# Patient Record
Sex: Male | Born: 1945 | Race: White | Hispanic: No | Smoking: Former smoker
Health system: Southern US, Community
[De-identification: ages and names within clinical notes are randomized; demographics above are authoritative.]

## PROBLEM LIST (undated history)

## (undated) DIAGNOSIS — Z87442 Personal history of urinary calculi: Secondary | ICD-10-CM

## (undated) DIAGNOSIS — E119 Type 2 diabetes mellitus without complications: Secondary | ICD-10-CM

## (undated) DIAGNOSIS — I10 Essential (primary) hypertension: Secondary | ICD-10-CM

## (undated) DIAGNOSIS — K219 Gastro-esophageal reflux disease without esophagitis: Secondary | ICD-10-CM

## (undated) DIAGNOSIS — M1990 Unspecified osteoarthritis, unspecified site: Secondary | ICD-10-CM

## (undated) DIAGNOSIS — I714 Abdominal aortic aneurysm, without rupture, unspecified: Secondary | ICD-10-CM

## (undated) DIAGNOSIS — I739 Peripheral vascular disease, unspecified: Secondary | ICD-10-CM

## (undated) DIAGNOSIS — N400 Enlarged prostate without lower urinary tract symptoms: Secondary | ICD-10-CM

## (undated) DIAGNOSIS — J189 Pneumonia, unspecified organism: Secondary | ICD-10-CM

## (undated) HISTORY — DX: Abdominal aortic aneurysm, without rupture, unspecified: I71.40

## (undated) HISTORY — DX: Abdominal aortic aneurysm, without rupture: I71.4

## (undated) HISTORY — PX: HERNIA REPAIR: SHX51

---

## 2006-12-27 HISTORY — PX: TRANSURETHRAL RESECTION OF PROSTATE: SHX73

## 2007-01-24 ENCOUNTER — Encounter: Admission: RE | Admit: 2007-01-24 | Discharge: 2007-01-24 | Payer: Self-pay | Admitting: Family Medicine

## 2007-08-09 ENCOUNTER — Encounter: Admission: RE | Admit: 2007-08-09 | Discharge: 2007-08-09 | Payer: Self-pay | Admitting: Orthopedic Surgery

## 2007-12-31 IMAGING — CR DG SHOULDER 2+V*L*
3 series · 3 of 3 positions shown · non-contrast
Comparison: none

CLINICAL DATA: Left shoulder pain, no injury

LEFT SHOULDER - 3 VIEW

[w shoulder ap internal left]
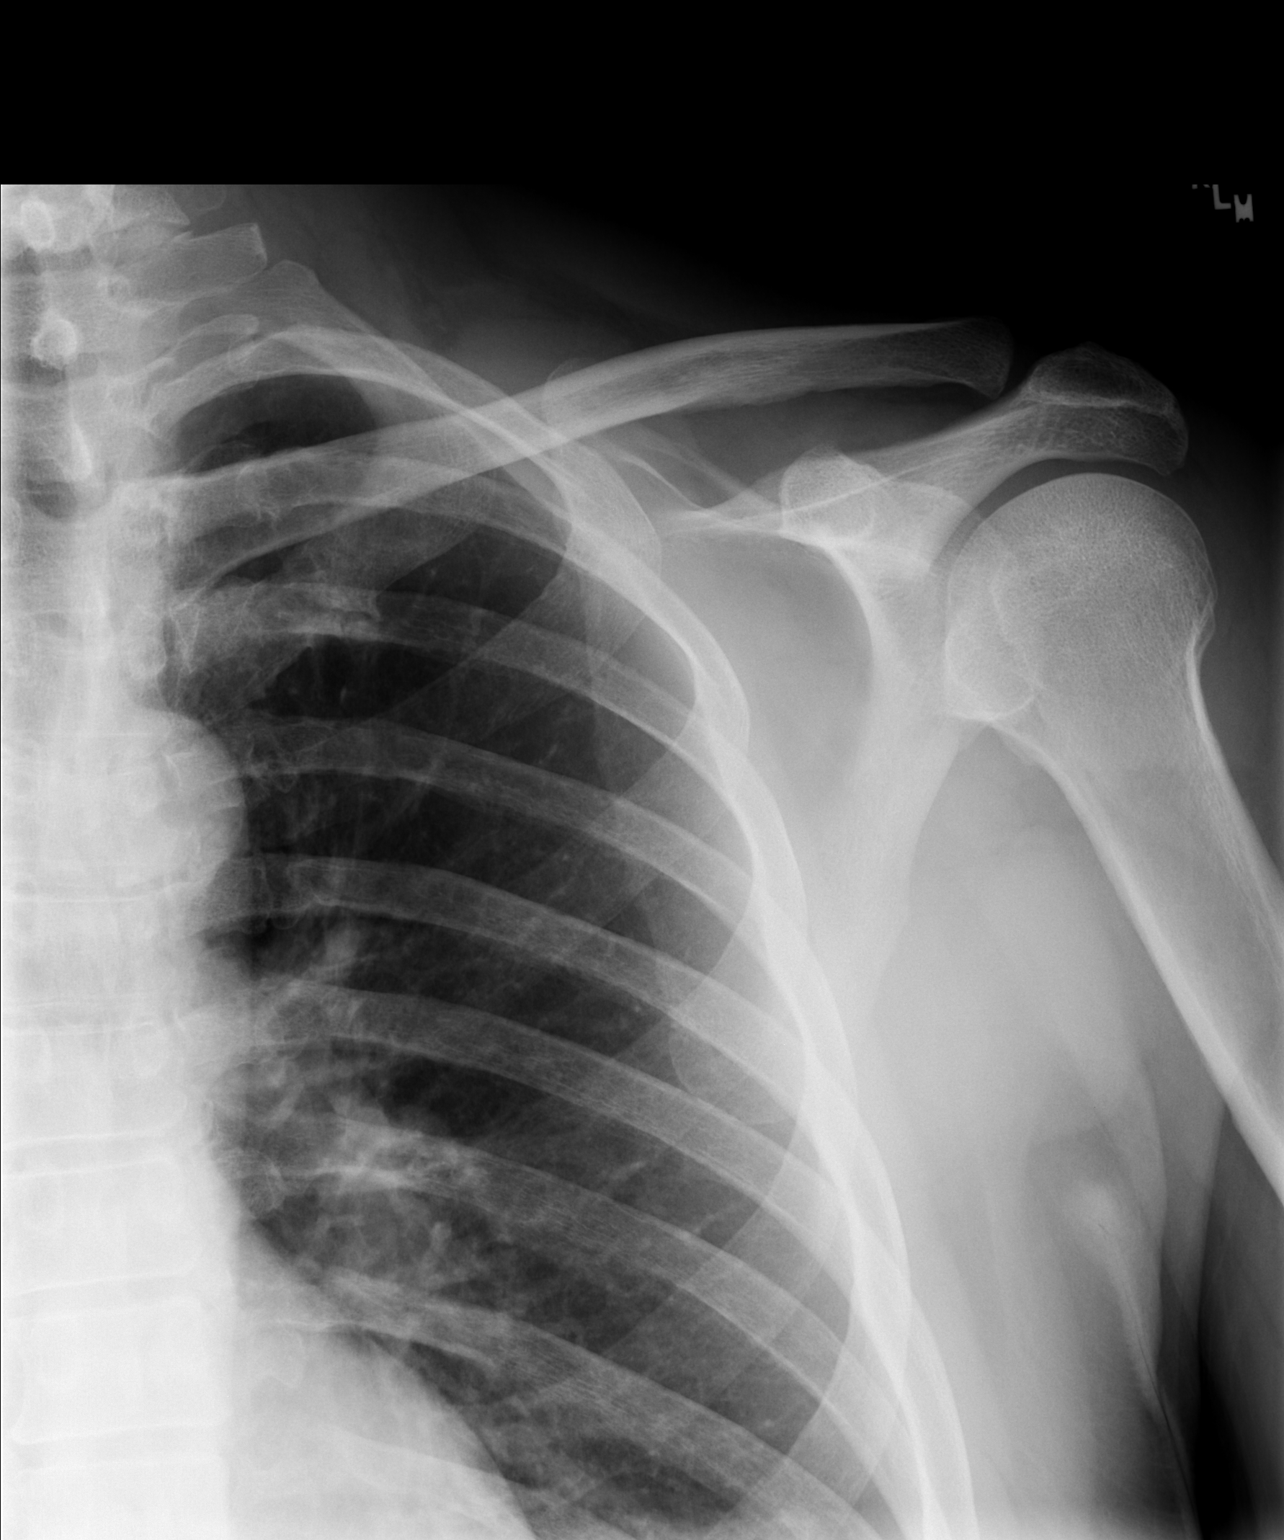

[w shoulder ap external left]
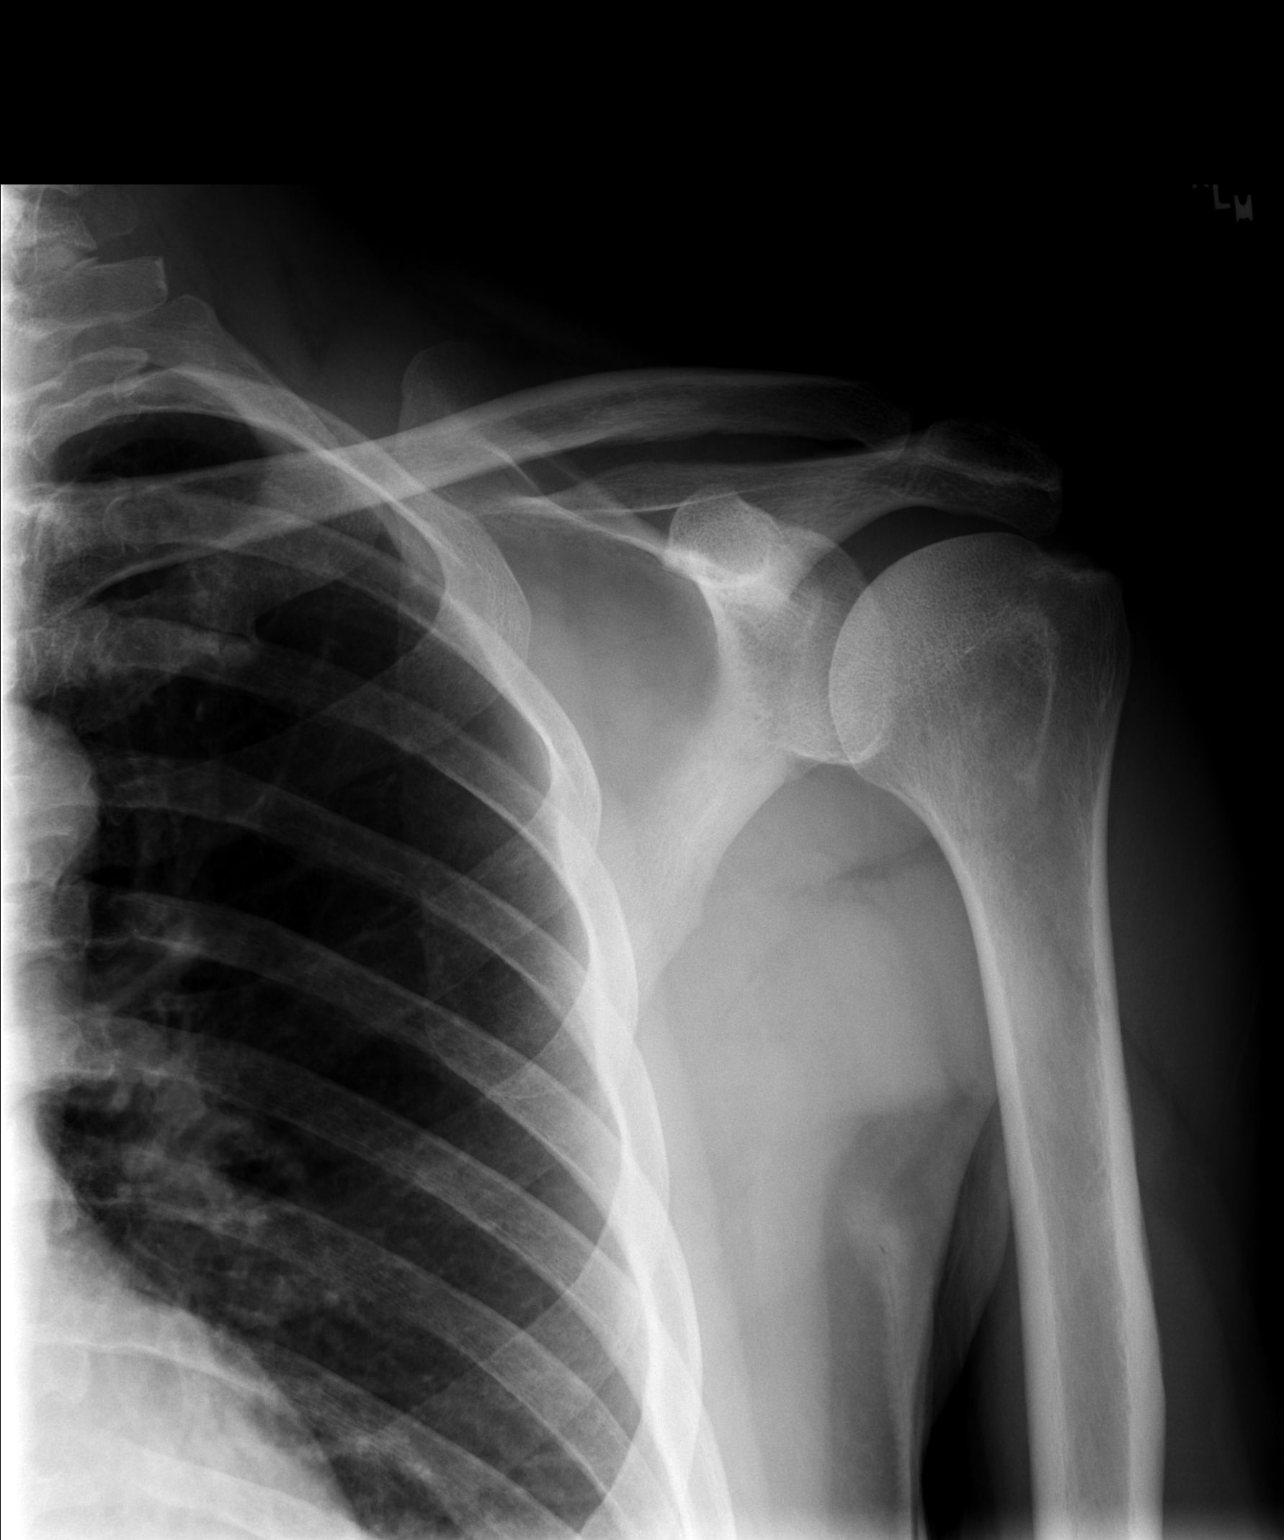

[w shoulder y view left]
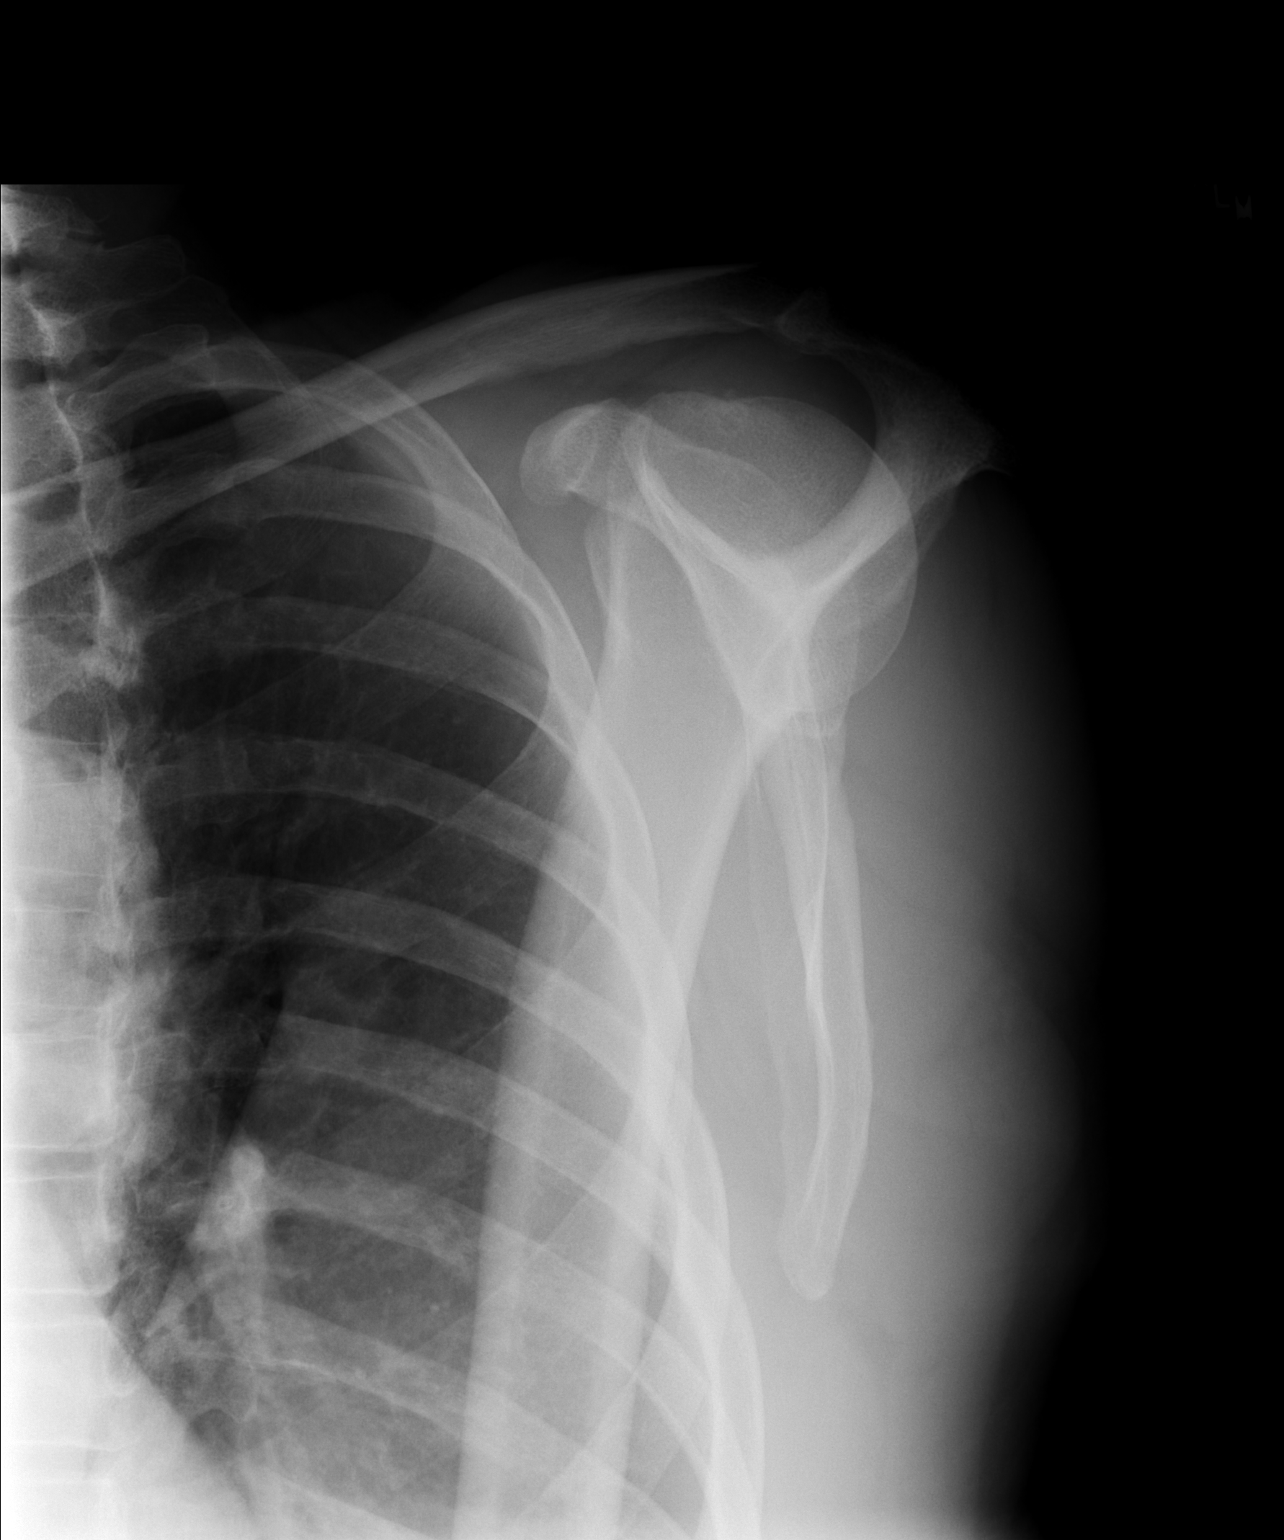

[3 of 3 positions shown; findings below may reference images not displayed]

FINDINGS: There is cortical irregularity at the insertion of the rotator cuff
compatible with chronic rotator cuff degenerative change. Early degenerative
disease noted in the left AC joint. No fracture, subluxation, or dislocation.

IMPRESSION

Findings compatible with chronic rotator cuff disease with irregularity at the
insertion site of the rotator cuff.

No acute findings.

## 2015-01-17 ENCOUNTER — Encounter (HOSPITAL_COMMUNITY): Payer: Self-pay | Admitting: *Deleted

## 2015-01-21 ENCOUNTER — Other Ambulatory Visit: Payer: Self-pay | Admitting: Gastroenterology

## 2015-01-22 ENCOUNTER — Encounter (HOSPITAL_COMMUNITY)
Admission: RE | Disposition: A | Discharge status: Home / Self Care | Payer: Self-pay | Source: Ambulatory Visit | Attending: Gastroenterology

## 2015-01-22 ENCOUNTER — Ambulatory Visit (HOSPITAL_COMMUNITY)
Admission: RE | Admit: 2015-01-22 | Discharge: 2015-01-22 | Disposition: A | Discharge status: Home / Self Care | Payer: Medicare Other | Source: Ambulatory Visit | Attending: Gastroenterology | Admitting: Gastroenterology

## 2015-01-22 ENCOUNTER — Ambulatory Visit (HOSPITAL_COMMUNITY): Payer: Medicare Other | Admitting: Anesthesiology

## 2015-01-22 ENCOUNTER — Encounter (HOSPITAL_COMMUNITY): Payer: Self-pay | Admitting: *Deleted

## 2015-01-22 DIAGNOSIS — Z7982 Long term (current) use of aspirin: Secondary | ICD-10-CM | POA: Insufficient documentation

## 2015-01-22 DIAGNOSIS — Z87891 Personal history of nicotine dependence: Secondary | ICD-10-CM | POA: Insufficient documentation

## 2015-01-22 DIAGNOSIS — K219 Gastro-esophageal reflux disease without esophagitis: Secondary | ICD-10-CM | POA: Insufficient documentation

## 2015-01-22 DIAGNOSIS — K319 Disease of stomach and duodenum, unspecified: Secondary | ICD-10-CM | POA: Diagnosis not present

## 2015-01-22 HISTORY — PX: FINE NEEDLE ASPIRATION: SHX5430

## 2015-01-22 HISTORY — DX: Unspecified osteoarthritis, unspecified site: M19.90

## 2015-01-22 HISTORY — PX: EUS: SHX5427

## 2015-01-22 HISTORY — DX: Gastro-esophageal reflux disease without esophagitis: K21.9

## 2015-01-22 SURGERY — ESOPHAGEAL ENDOSCOPIC ULTRASOUND (EUS) RADIAL
Anesthesia: Monitor Anesthesia Care

## 2015-01-22 MED ORDER — PROPOFOL 10 MG/ML IV BOLUS
INTRAVENOUS | Status: AC
  Filled 2015-01-22: qty 20

## 2015-01-22 MED ORDER — PROPOFOL INFUSION 10 MG/ML OPTIME
INTRAVENOUS | Status: DC | PRN
  Administered 2015-01-22: 100 ug/kg/min via INTRAVENOUS

## 2015-01-22 MED ORDER — FENTANYL CITRATE 0.05 MG/ML IJ SOLN: 25.0000 ug | INTRAMUSCULAR | Status: DC | PRN

## 2015-01-22 MED ORDER — LACTATED RINGERS IV SOLN: INTRAVENOUS | Status: DC

## 2015-01-22 MED ORDER — SODIUM CHLORIDE 0.9 % IV SOLN: INTRAVENOUS | Status: DC

## 2015-01-22 MED ORDER — LACTATED RINGERS IV SOLN
INTRAVENOUS | Status: DC
  Administered 2015-01-22: 11:00:00 via INTRAVENOUS

## 2015-01-22 NOTE — Transfer of Care (Signed)
Immediate Anesthesia Transfer of Care Note  Patient: Charles Hartman  Procedure(s) Performed: Procedure(s) (LRB): ESOPHAGEAL ENDOSCOPIC ULTRASOUND (EUS) RADIAL (N/A) FINE NEEDLE ASPIRATION (FNA) LINEAR (N/A)  Patient Location: PACU  Anesthesia Type: MAC  Level of Consciousness: sedated, patient cooperative and responds to stimulation  Airway & Oxygen Therapy: Patient Spontanous Breathing and Patient connected to face mask oxgen  Post-op Assessment: Report given to PACU RN and Post -op Vital signs reviewed and stable  Post vital signs: Reviewed and stable  Complications: No apparent anesthesia complications

## 2015-01-22 NOTE — Anesthesia Preprocedure Evaluation (Addendum)
Anesthesia Evaluation  Patient identified by MRN, date of birth, ID band Patient awake    Reviewed: Allergy & Precautions, H&P , NPO status , Patient's Chart, lab work & pertinent test results  Airway Mallampati: II  TM Distance: >3 FB Neck ROM: full    Dental  (+) Edentulous Upper, Edentulous Lower, Dental Advisory Given   Pulmonary neg pulmonary ROS, former smoker,  breath sounds clear to auscultation  Pulmonary exam normal       Cardiovascular Exercise Tolerance: Good negative cardio ROS  Rhythm:regular Rate:Normal     Neuro/Psych negative neurological ROS  negative psych ROS   GI/Hepatic negative GI ROS, Neg liver ROS, GERD-  Medicated and Controlled,  Endo/Other  negative endocrine ROS  Renal/GU negative Renal ROS  negative genitourinary   Musculoskeletal   Abdominal   Peds  Hematology negative hematology ROS (+)   Anesthesia Other Findings   Reproductive/Obstetrics negative OB ROS                           Anesthesia Physical Anesthesia Plan  ASA: II  Anesthesia Plan: MAC   Post-op Pain Management:    Induction:   Airway Management Planned:   Additional Equipment:   Intra-op Plan:   Post-operative Plan:   Informed Consent: I have reviewed the patients History and Physical, chart, labs and discussed the procedure including the risks, benefits and alternatives for the proposed anesthesia with the patient or authorized representative who has indicated his/her understanding and acceptance.   Dental Advisory Given  Plan Discussed with: CRNA and Surgeon  Anesthesia Plan Comments:         Anesthesia Quick Evaluation

## 2015-01-22 NOTE — Anesthesia Postprocedure Evaluation (Signed)
  Anesthesia Post-op Note  Patient: Charles Hartman  Procedure(s) Performed: Procedure(s) (LRB): ESOPHAGEAL ENDOSCOPIC ULTRASOUND (EUS) RADIAL (N/A) FINE NEEDLE ASPIRATION (FNA) LINEAR (N/A)  Patient Location: PACU  Anesthesia Type: MAC  Level of Consciousness: awake and alert   Airway and Oxygen Therapy: Patient Spontanous Breathing  Post-op Pain: mild  Post-op Assessment: Post-op Vital signs reviewed, Patient's Cardiovascular Status Stable, Respiratory Function Stable, Patent Airway and No signs of Nausea or vomiting  Last Vitals:  Filed Vitals:   01/22/15 1244  BP: 98/61  Pulse: 67  Temp:   Resp: 23    Post-op Vital Signs: stable   Complications: No apparent anesthesia complications

## 2015-01-22 NOTE — Discharge Instructions (Signed)
Esophagogastroduodenoscopy °Care After °Refer to this sheet in the next few weeks. These instructions provide you with information on caring for yourself after your procedure. Your caregiver may also give you more specific instructions. Your treatment has been planned according to current medical practices, but problems sometimes occur. Call your caregiver if you have any problems or questions after your procedure.  °HOME CARE INSTRUCTIONS °· Do not eat or drink anything until the numbing medicine (local anesthetic) has worn off and your gag reflex has returned. You will know that the local anesthetic has worn off when you can swallow comfortably. °· Do not drive for 12 hours after the procedure or as directed by your caregiver. °· Only take medicines as directed by your caregiver. °SEEK MEDICAL CARE IF:  °· You cannot stop coughing. °· You are not urinating at all or less than usual. °SEEK IMMEDIATE MEDICAL CARE IF: °· You have difficulty swallowing. °· You cannot eat or drink. °· You have worsening throat or chest pain. °· You have dizziness, lightheadedness, or you faint. °· You have nausea or vomiting. °· You have chills. °· You have a fever. °· You have severe abdominal pain. °· You have black, tarry, or bloody stools. °Document Released: 11/29/2012 Document Reviewed: 11/29/2012 °ExitCare® Patient Information ©2015 [employer]. This information is not intended to replace advice given to you by your health care provider. Make sure you discuss any questions you have with your health care provider. ° °

## 2015-01-22 NOTE — Op Note (Signed)
[facility] [address] [place], [id number]   ENDOSCOPIC ULTRASOUND PROCEDURE REPORT  PATIENT: Charles Hartman  MR#: [id number] BIRTHDATE: 1946-10-24  GENDER: male ENDOSCOPIST: Willis ModenaWilliam Shailah Gibbins, MD REFERRED BY:  Webb Silversmithobert Butler, M.D. PROCEDURE DATE:  01/22/2015 PROCEDURE:   Upper EUS ASA CLASS:      Class II INDICATIONS:   1.  gastric nodule. MEDICATIONS: Monitored anesthesia care  DESCRIPTION OF PROCEDURE:   After the risks benefits and alternatives of the procedure were  explained, informed consent was obtained. The patient was then placed in the left, lateral, decubitus postion and IV sedation was administered. Throughout the procedure, the patients blood pressure, pulse and oxygen saturations were monitored continuously.  Under direct visualization, the     endoscope was introduced through the mouth and advanced to the bulb of duodenum .  Water was used as necessary to provide an acoustic interface.  Upon completion of the imaging, water was removed and the patient was sent to the recovery room in satisfactory condition.    FINDINGS:      10mm submucosal-appearing nodule seen in fundus, with normal overlying mucosa.  Lesion corresponds with very hypoechoic approximately 10mm lesion arising seemingly from muscularis mucosa. Gastric wall layers otherwise normal.  No perigastric adenopathy.   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  RECOMMENDATIONS:     1.  Watch for potential complications of procedure. 2.  Repeat EGD +/- EUS in 2-3 years for surveillance of this nodule. 3.  Will discuss with Dr. Charm BargesButler.   _______________________________ Charles DoctoreSignedWillis Modena:  Fabianna Keats, MD 01/22/2015 12:41 PM   CC:

## 2015-01-22 NOTE — Addendum Note (Signed)
Addended by: Chianti Goh on: 01/22/2015 08:58 AM   Modules accepted: Orders  

## 2015-01-22 NOTE — H&P (Signed)
Patient interval history reviewed.  Patient examined again.  There has been no change from documented H/P dated 01/16/15 (scanned into chart from our office) except as documented above.  Assessment:  1.  Gastric nodule.  Plan:  1.  Endoscopic ultrasound with possible fine needle aspiration biopsies. 2.  Risks (bleeding, infection, bowel perforation that could require surgery, sedation-related changes in cardiopulmonary systems), benefits (identification and possible treatment of source of symptoms, exclusion of certain causes of symptoms), and alternatives (watchful waiting, radiographic imaging studies, empiric medical treatment) of upper endoscopy with ultrasound and possible fine needle aspiration biopsies (EUS +/- FNA) were explained to patient/family in detail and patient wishes to proceed.

## 2015-01-23 ENCOUNTER — Encounter (HOSPITAL_COMMUNITY): Payer: Self-pay | Admitting: Gastroenterology

## 2015-01-28 ENCOUNTER — Encounter (HOSPITAL_COMMUNITY): Payer: Self-pay | Admitting: Gastroenterology

## 2016-10-30 ENCOUNTER — Encounter (HOSPITAL_COMMUNITY): Payer: Self-pay | Admitting: Emergency Medicine

## 2016-10-30 ENCOUNTER — Emergency Department (HOSPITAL_COMMUNITY)
Admission: EM | Admit: 2016-10-30 | Discharge: 2016-10-31 | Disposition: A | Discharge status: Home / Self Care | Payer: Medicare Other | Attending: Emergency Medicine | Admitting: Emergency Medicine

## 2016-10-30 DIAGNOSIS — Z7982 Long term (current) use of aspirin: Secondary | ICD-10-CM | POA: Diagnosis not present

## 2016-10-30 DIAGNOSIS — G8918 Other acute postprocedural pain: Secondary | ICD-10-CM | POA: Insufficient documentation

## 2016-10-30 DIAGNOSIS — Z79899 Other long term (current) drug therapy: Secondary | ICD-10-CM | POA: Insufficient documentation

## 2016-10-30 DIAGNOSIS — N4889 Other specified disorders of penis: Secondary | ICD-10-CM | POA: Insufficient documentation

## 2016-10-30 DIAGNOSIS — Z87891 Personal history of nicotine dependence: Secondary | ICD-10-CM | POA: Insufficient documentation

## 2016-10-30 HISTORY — DX: Benign prostatic hyperplasia without lower urinary tract symptoms: N40.0

## 2016-10-30 LAB — URINE MICROSCOPIC-ADD ON

## 2016-10-30 LAB — URINALYSIS, ROUTINE W REFLEX MICROSCOPIC
Glucose, UA: NEGATIVE mg/dL
KETONES UR: 40 mg/dL — AB
NITRITE: NEGATIVE
Protein, ur: >300 mg/dL — AB
SPECIFIC GRAVITY, URINE: 1.028 (ref 1.005–1.030)
pH: 6 (ref 5.0–8.0)

## 2016-10-30 MED ORDER — OXYCODONE HCL 5 MG PO TABS
5.0000 mg | ORAL_TABLET | Freq: Once | ORAL | Status: AC
  Administered 2016-10-30: 5 mg via ORAL
  Filled 2016-10-30: qty 1

## 2016-10-30 NOTE — ED Provider Notes (Signed)
[facility]-EMERGENCY DEPT Provider Note   CSN: [id number] Arrival date & time: 10/30/16  2042  By signing my name below, I, Alyssa GroveMartin Green, attest that this documentation has been prepared under the direction and in the presence of TXU CorpHannah Johnathin Vanderschaaf, PA-C. Electronically Signed: Alyssa GroveMartin Green, ED Scribe. 10/30/16. 10:41 PM.  History   Chief Complaint Chief Complaint  Patient presents with  . Post-op Problem   The history is provided by the patient, a relative and the spouse. No language interpreter was used.   HPI Comments: Charles Hartman is a 70 y.o. male with who presents to the Emergency Department complaining of gradual onset, intermittent, spastic penile pain focalized to the tip of his penis onset 4 days. Spasms are brought on when pt goes to urinate. Pain is similar to when pt's bladder was completely closed off. Prescribed hydrocodone relieves pain enough for pt to sleep for a few hours, but relief does not last until next dose of hydrocodone. [facility] Dr[facility] Salvatore Decentaberwal. Pt has been experiencing pain since procedure. Pt had a large amount of prostate resected. Reports associated leaking around foley catheter (beginning yesterday), hematuria and decreased appetite. Hematuria began after surgery, but has gradually improved. Foley catheter has stopped leaking since using leg bag. Pt has not been able to contact Dr. Salvatore Decentaberwal since procedure. He denies increased Fever, chills, penile swelling, penile redness, nausea, vomiting, chest pain, abdominal pain, shortness of breath, bladder pain, fever, back pain.  Past Medical History:  Diagnosis Date  . Arthritis   . Enlarged prostate   . GERD (gastroesophageal reflux disease)     There are no active problems to display for this patient.   Past Surgical History:  Procedure Laterality Date  . EUS N/A 01/22/2015   Procedure: ESOPHAGEAL ENDOSCOPIC ULTRASOUND (EUS) RADIAL;  Surgeon: Willis ModenaWilliam Outlaw, MD;   Location: [facility] ENDOSCOPY;  Service: Endoscopy;  Laterality: N/A;  . FINE NEEDLE ASPIRATION N/A 01/22/2015   Procedure: FINE NEEDLE ASPIRATION (FNA) LINEAR;  Surgeon: Willis ModenaWilliam Outlaw, MD;  Location: [facility] ENDOSCOPY;  Service: Endoscopy;  Laterality: N/A;  . HERNIA REPAIR     bilateral inguinal hernia  . TRANSURETHRAL RESECTION OF PROSTATE      Home Medications    Prior to Admission medications   Medication Sig Start Date End Date Taking? Authorizing Provider  aspirin EC 81 MG tablet Take 162 mg by mouth every evening.   Yes Historical Provider, MD  ciprofloxacin (CIPRO) 500 MG tablet Take 500 mg by mouth 2 (two) times daily. 10/28/16  Yes Historical Provider, MD  fenofibrate micronized (LOFIBRA) 134 MG capsule Take 134 mg by mouth every evening.   Yes Historical Provider, MD  HYDROcodone-acetaminophen (NORCO) 7.5-325 MG tablet Take 1 tablet by mouth every 6 (six) hours as needed for moderate pain or severe pain.  10/28/16  Yes Historical Provider, MD  lovastatin (MEVACOR) 40 MG tablet Take 40 mg by mouth at bedtime.   Yes Historical Provider, MD  RABEprazole (ACIPHEX) 20 MG tablet Take 20 mg by mouth daily.   Yes Historical Provider, MD  tamsulosin (FLOMAX) 0.4 MG CAPS capsule Take 0.4 mg by mouth at bedtime.   Yes Historical Provider, MD  temazepam (RESTORIL) 15 MG capsule Take 15 mg by mouth at bedtime.   Yes Historical Provider, MD  oxyCODONE (ROXICODONE) 5 MG immediate release tablet Take 1 tablet (5 mg total) by mouth every 6 (six) hours as needed for breakthrough pain. 10/31/16   Dahlia ClientHannah Emma Birchler, PA-C  Family History No family history on file.  Social History Social History  Substance Use Topics  . Smoking status: Former Smoker    Years: 50.00    Types: Cigarettes    Quit date: 10/07/2013  . Smokeless tobacco: Never Used  . Alcohol use No    Allergies   Novocain [procaine]  Review of Systems Review of Systems  Constitutional: Negative for fever.  Respiratory: Negative for  shortness of breath.   Cardiovascular: Negative for chest pain.  Gastrointestinal: Negative for abdominal pain, nausea and vomiting.  Genitourinary: Positive for hematuria and penile pain. Negative for penile swelling.       - Penile redness  Musculoskeletal: Negative for back pain.  All other systems reviewed and are negative.  Physical Exam Updated Vital Signs BP 130/91 (BP Location: Right Arm)   Pulse 113   Temp 99 F (37.2 C) (Oral)   Resp 16   Ht 5\' 8"  (1.727 m)   Wt 200 lb (90.7 kg)   SpO2 95%   BMI 30.41 kg/m   Physical Exam  Constitutional: He appears well-developed and well-nourished. No distress.  Awake, alert, nontoxic appearance  HENT:  Head: Normocephalic and atraumatic.  Mouth/Throat: Oropharynx is clear and moist. No oropharyngeal exudate.  Eyes: Conjunctivae are normal. No scleral icterus.  Neck: Normal range of motion. Neck supple.  Cardiovascular: Regular rhythm and intact distal pulses.  Tachycardia present.   Pulmonary/Chest: Effort normal and breath sounds normal. No respiratory distress. He has no wheezes.  Equal chest expansion  Abdominal: Soft. Bowel sounds are normal. He exhibits no mass. There is no tenderness. There is no rebound and no guarding.  Genitourinary: Uncircumcised.  Genitourinary Comments: Urinary catheter in place. No erythema of the penis or glands. No balanitis. No paraphimosis. Urine leaking from around urinary catheter at the opening of the urethra  Musculoskeletal: Normal range of motion. He exhibits no edema.  Neurological: He is alert.  Speech is clear and goal oriented Moves extremities without ataxia  Skin: Skin is warm and dry. He is not diaphoretic.  Psychiatric: He has a normal mood and affect.  Nursing note and vitals reviewed.  ED Treatments / Results  DIAGNOSTIC STUDIES: Oxygen Saturation is 95% on RA, adequate by my interpretation.    COORDINATION OF CARE: 10:26 PM Discussed treatment plan with pt at bedside  which includes Oxycodone, bladder scan, irrigation of foley catheter and pt agreed to plan.  Labs (all labs ordered are listed, but only abnormal results are displayed) Labs Reviewed  URINALYSIS, ROUTINE W REFLEX MICROSCOPIC (NOT AT [facility]) - Abnormal; Notable for the following:       Result Value   Color, Urine AMBER (*)    APPearance CLOUDY (*)    Hgb urine dipstick LARGE (*)    Bilirubin Urine MODERATE (*)    Ketones, ur 40 (*)    Protein, ur >300 (*)    Leukocytes, UA SMALL (*)    All other components within normal limits  URINE MICROSCOPIC-ADD ON - Abnormal; Notable for the following:    Squamous Epithelial / LPF 0-5 (*)    Bacteria, UA RARE (*)    All other components within normal limits  URINE CULTURE   Procedures Procedures (including critical care time)  Medications Ordered in ED Medications  oxyCODONE (Oxy IR/ROXICODONE) immediate release tablet 5 mg (5 mg Oral Given 10/30/16 2244)     Initial Impression / Assessment and Plan / ED Course  I have reviewed the triage vital signs and  the nursing notes.  Pertinent labs & imaging results that were available during my care of the patient were reviewed by me and considered in my medical decision making (see chart for details).  Clinical Course  Comment By Time  The patient was discussed with and seen by Dr. Eudelia Bunchardama who agrees with the treatment plan.  Dierdre ForthHannah Itzabella Sorrels, PA-C 11/05 0126  Multiple attempts to reach patient's urologist were unsuccessful. Dahlia ClientHannah Lavalle Skoda, PA-C 11/05 0126  Patient's tachycardia has resolved with decrease in pain. Dierdre ForthHannah Obi Scrima, PA-C 11/05 0127   Patient with complaints of pain at the urethra. Abdomen soft and nontender. No evidence of infection. Urine is not overtly infected and urine culture was sent.  He is currently taking ciprofloxacin. He reports that Vicodin helps for a while and then pain returns. Does report urinary leakage from around the site of the catheter. Bladder scan  with 21 mL's. Bladder irrigated with 250 mL's of saline with improvement in pain. No further leakage from around the catheter site. Patient pain well controlled here in the emergency department. He has an appointment with his urologist on Monday. Discussed reasons to return to the emergency department including fevers, abdominal pain or other concerns.  I personally performed the services described in this documentation, which was scribed in my presence. The recorded information has been reviewed and is accurate.   Final Clinical Impressions(s) / ED Diagnoses   Final diagnoses:  Post-operative pain    New Prescriptions Discharge Medication List as of 10/31/2016 12:11 AM    START taking these medications   Details  oxyCODONE (ROXICODONE) 5 MG immediate release tablet Take 1 tablet (5 mg total) by mouth every 6 (six) hours as needed for breakthrough pain., Starting Sun 10/31/2016, Print         Jessilyn Catino, PA-C 10/31/16 0130    Nira ConnPedro Eduardo Cardama, MD 11/03/16 [id number]

## 2016-10-30 NOTE — ED Triage Notes (Signed)
Pt from home following a TURP procedure on Wednesday. Pt states he has intense pain at the tip of his penis. Pt states he has intense pain that comes and goes. Pt denies pain in his bladder. Pt states his urine output in his foley is tea colored and not bright red. Pt states his surgeon told him to come be evaluated. Pt states he had this done at [facility].

## 2016-10-31 MED ORDER — OXYCODONE HCL 5 MG PO TABS: 5.0000 mg | ORAL_TABLET | Freq: Four times a day (QID) | ORAL | 0 refills | Status: DC | PRN

## 2016-10-31 NOTE — Discharge Instructions (Signed)
1. Medications: oxycodone for breakthrough pain, usual home medications 2. Treatment: rest, drink plenty of fluids,  3. Follow Up: Please followup with your Urologist in 2 days at your appointment for discussion of your diagnoses and further evaluation after today's visit; if you do not have a primary care doctor use the resource guide provided to find one; Please return to the ER for worsening pain, continued leakage around the catheter, fever or other concerns

## 2016-11-01 LAB — URINE CULTURE: Culture: NO GROWTH

## 2020-11-10 ENCOUNTER — Other Ambulatory Visit: Payer: Self-pay | Admitting: Physician Assistant

## 2020-11-10 ENCOUNTER — Ambulatory Visit (HOSPITAL_COMMUNITY)
Admission: RE | Admit: 2020-11-10 | Discharge: 2020-11-10 | Disposition: A | Discharge status: Home / Self Care | Payer: Medicare Other | Source: Ambulatory Visit | Attending: Pulmonary Disease | Admitting: Pulmonary Disease

## 2020-11-10 DIAGNOSIS — U071 COVID-19: Secondary | ICD-10-CM | POA: Diagnosis not present

## 2020-11-10 DIAGNOSIS — Z6830 Body mass index (BMI) 30.0-30.9, adult: Secondary | ICD-10-CM | POA: Diagnosis not present

## 2020-11-10 DIAGNOSIS — R54 Age-related physical debility: Secondary | ICD-10-CM | POA: Diagnosis not present

## 2020-11-10 DIAGNOSIS — Z23 Encounter for immunization: Secondary | ICD-10-CM | POA: Diagnosis not present

## 2020-11-10 MED ORDER — SOTROVIMAB 500 MG/8ML IV SOLN
500.0000 mg | Freq: Once | INTRAVENOUS | Status: AC
  Administered 2020-11-10: 500 mg via INTRAVENOUS

## 2020-11-10 MED ORDER — FAMOTIDINE IN NACL 20-0.9 MG/50ML-% IV SOLN: 20.0000 mg | Freq: Once | INTRAVENOUS | Status: DC | PRN

## 2020-11-10 MED ORDER — METHYLPREDNISOLONE SODIUM SUCC 125 MG IJ SOLR: 125.0000 mg | Freq: Once | INTRAMUSCULAR | Status: DC | PRN

## 2020-11-10 MED ORDER — SODIUM CHLORIDE 0.9 % IV SOLN: INTRAVENOUS | Status: DC | PRN

## 2020-11-10 MED ORDER — EPINEPHRINE 0.3 MG/0.3ML IJ SOAJ: 0.3000 mg | Freq: Once | INTRAMUSCULAR | Status: DC | PRN

## 2020-11-10 MED ORDER — ALBUTEROL SULFATE HFA 108 (90 BASE) MCG/ACT IN AERS: 2.0000 | INHALATION_SPRAY | Freq: Once | RESPIRATORY_TRACT | Status: DC | PRN

## 2020-11-10 MED ORDER — DIPHENHYDRAMINE HCL 50 MG/ML IJ SOLN: 50.0000 mg | Freq: Once | INTRAMUSCULAR | Status: DC | PRN

## 2020-11-10 NOTE — Progress Notes (Signed)
I connected by phone with [name] on 11/10/2020 at 11:01 AM to discuss the potential use of a new treatment for mild to moderate COVID-19 viral infection in non-hospitalized patients.  This patient is a 74 y.o. male that meets the FDA criteria for Emergency Use Authorization of COVID monoclonal antibody sotrovimab, casirivimab/imdevimab or bamlamivimab/estevimab.  Has a (+) direct SARS-CoV-2 viral test result  Has mild or moderate COVID-19   Is NOT hospitalized due to COVID-19  Is within 10 days of symptom onset  Has at least one of the high risk factor(s) for progression to severe COVID-19 and/or hospitalization as defined in EUA.  Specific high risk criteria : Older age (>/= 75 yo) and BMI > 25   I have spoken and communicated the following to the patient or parent/caregiver regarding COVID monoclonal antibody treatment:  1. FDA has authorized the emergency use for the treatment of mild to moderate COVID-19 in adults and pediatric patients with positive results of direct SARS-CoV-2 viral testing who are 44 years of age and older weighing at least 40 kg, and who are at high risk for progressing to severe COVID-19 and/or hospitalization.  2. The significant known and potential risks and benefits of COVID monoclonal antibody, and the extent to which such potential risks and benefits are unknown.  3. Information on available alternative treatments and the risks and benefits of those alternatives, including clinical trials.  4. Patients treated with COVID monoclonal antibody should continue to self-isolate and use infection control measures (e.g., wear mask, isolate, social distance, avoid sharing personal items, clean and disinfect "high touch" surfaces, and frequent handwashing) according to CDC guidelines.   5. The patient or parent/caregiver has the option to accept or refuse COVID monoclonal antibody treatment.  After reviewing this information with the patient, the patient has  agreed to receive one of the available covid 19 monoclonal antibodies and will be provided an appropriate fact sheet prior to infusion.  Sx onset 11/6. Set up for infusion on 11/15 @ 3:30pm. Directions given to [name]. Pt is aware that insurance will be charged an infusion fee. Pt is fully vaccinated.   [name] 11/10/2020 11:01 AM

## 2020-11-10 NOTE — Progress Notes (Signed)
Diagnosis: COVID-19  Physician: Dr. Patrick Wright  Procedure: [facility] Infusion [facility] Med: Sotrovimab infusion - Provided patient with sotrovimab fact sheet for patients, parents, and caregivers prior to infusion.   Complications: No immediate complications noted  Discharge: Discharged home    

## 2020-11-10 NOTE — Discharge Instructions (Signed)

## 2021-05-29 ENCOUNTER — Ambulatory Visit (INDEPENDENT_AMBULATORY_CARE_PROVIDER_SITE_OTHER): Payer: Medicare Other | Admitting: Vascular Surgery

## 2021-05-29 ENCOUNTER — Other Ambulatory Visit: Payer: Self-pay

## 2021-05-29 ENCOUNTER — Encounter: Payer: Self-pay | Admitting: Vascular Surgery

## 2021-05-29 VITALS — BP 166/104 | HR 82 | Temp 98.6°F | Resp 20 | Ht 68.0 in | Wt 195.0 lb

## 2021-05-29 DIAGNOSIS — I714 Abdominal aortic aneurysm, without rupture, unspecified: Secondary | ICD-10-CM

## 2021-05-29 NOTE — Progress Notes (Signed)
Patient ID: Charles Hartman, male   DOB: 1946-10-16, 75 y.o.   MRN: [id number]  Reason for Consult: New Patient (Initial Visit)   Referred by Ailene Ravel, MD  Subjective:     HPI:  Charles Hartman is a 75 y.o. male without a known personal or family history of aneurysm disease.  Recently diagnosed with diverticulitis found incidentally a 5.2 cm aneurysm.  Other than the diverticulitis no new back or abdominal pain.  He has no history of coronary artery, carotid artery or other vascular diseases.  Specifically no history of stroke, TIA or amaurosis.  He is usually very active.  He does not take any blood thinners.  All of his siblings are deceased.  He does have 3 children they are a lifelong non-smoker.  Past Medical History:  Diagnosis Date  . Arthritis   . Enlarged prostate   . GERD (gastroesophageal reflux disease)    History reviewed. No pertinent family history. Past Surgical History:  Procedure Laterality Date  . EUS N/A 01/22/2015   Procedure: ESOPHAGEAL ENDOSCOPIC ULTRASOUND (EUS) RADIAL;  Surgeon: Willis Modena, MD;  Location: [facility];  Service: Endoscopy;  Laterality: N/A;  . FINE NEEDLE ASPIRATION N/A 01/22/2015   Procedure: FINE NEEDLE ASPIRATION (FNA) LINEAR;  Surgeon: Willis Modena, MD;  Location: [facility];  Service: Endoscopy;  Laterality: N/A;  . HERNIA REPAIR     bilateral inguinal hernia  . TRANSURETHRAL RESECTION OF PROSTATE      Short Social History:  Social History   Tobacco Use  . Smoking status: Former Smoker    Years: 50.00    Types: Cigarettes    Quit date: 10/07/2013    Years since quitting: 7.6  . Smokeless tobacco: Never Used  Substance Use Topics  . Alcohol use: No    Allergies  Allergen Reactions  . Novocain [Procaine] Anaphylaxis    Current Outpatient Medications  Medication Sig Dispense Refill  . aspirin EC 81 MG tablet Take 162 mg by mouth every evening.    . dutasteride (AVODART) 0.5 MG capsule Take 0.5 mg by mouth  daily.    . fenofibrate micronized (LOFIBRA) 134 MG capsule Take 134 mg by mouth every evening.    . lovastatin (MEVACOR) 40 MG tablet Take 40 mg by mouth at bedtime.    . RABEprazole (ACIPHEX) 20 MG tablet Take 20 mg by mouth daily.    . tamsulosin (FLOMAX) 0.4 MG CAPS capsule Take 0.4 mg by mouth at bedtime.    . temazepam (RESTORIL) 15 MG capsule Take 15 mg by mouth at bedtime.     No current facility-administered medications for this visit.    Review of Systems  Constitutional:  Constitutional negative. HENT: HENT negative.  Eyes: Eyes negative.  Respiratory: Respiratory negative.  Cardiovascular: Cardiovascular negative.  GI: Positive for abdominal pain.  Musculoskeletal: Musculoskeletal negative.  Skin: Skin negative.  Neurological: Neurological negative. Hematologic: Hematologic/lymphatic negative.  Psychiatric: Psychiatric negative.        Objective:  Objective  Vitals:   05/29/21 1409  BP: (!) 166/104  Pulse: 82  Resp: 20  Temp: 98.6 F (37 C)  SpO2: 95%    Physical Exam HENT:     Head: Normocephalic.     Nose:     Comments: Wearing a mask Eyes:     Pupils: Pupils are equal, round, and reactive to light.  Neck:     Vascular: No carotid bruit.  Cardiovascular:     Rate and Rhythm: Normal rate.  Pulses: Normal pulses.  Pulmonary:     Effort: Pulmonary effort is normal.  Abdominal:     General: Abdomen is flat.     Palpations: Abdomen is soft. There is no mass.  Musculoskeletal:        General: No swelling. Normal range of motion.     Cervical back: Normal range of motion.  Skin:    General: Skin is warm and dry.     Capillary Refill: Capillary refill takes less than 2 seconds.  Neurological:     General: No focal deficit present.     Mental Status: He is alert.  Psychiatric:        Mood and Affect: Mood normal.        Behavior: Behavior normal.        Thought Content: Thought content normal.        Judgment: Judgment normal.      Data: CT IMPRESSION: Changes consistent with sigmoid diverticulitis. No perforation or abscess is noted at this time.  5.2 cm abdominal aortic aneurysm. Recommend follow-up CT/MR every 6 months and vascular consultation. This recommendation follows ACR consensus guidelines: White Paper of the ACR Incidental Findings Committee II on Vascular Findings. J Am Coll Radiol 2013; 10:789-794.  Prominent prostate indenting upon the inferior aspect of the urinary bladder.  16 mm nonobstructing left renal stone in the upper pole.      Assessment/Plan:     75 year old male presents for evaluation of newly discovered 5.2 cm abdominal aortic aneurysm.  We reviewed his CT scan together today.  All of his questions were answered.  We will plan for repeat CT scan in 6 months.  I discussed with him the signs and symptoms of rupture as well as the indications for repair 5.5 cm or growth greater than half a centimeter which would put him over 5.5 cm.  He demonstrates very good understanding and plans to return with his daughter at our next visit for further discussion.     Maeola Harman MD [facility]

## 2021-10-30 ENCOUNTER — Other Ambulatory Visit: Payer: Self-pay

## 2021-10-30 DIAGNOSIS — I714 Abdominal aortic aneurysm, without rupture, unspecified: Secondary | ICD-10-CM

## 2021-11-02 ENCOUNTER — Telehealth: Payer: Self-pay

## 2021-11-02 NOTE — Telephone Encounter (Signed)
-----   Message from Lorenza Cambridge sent at 11/02/2021 11:37 AM EST ----- Regarding: RE: CTA 11/02/21 - called patient at home.  No answer.  Unable to leave a vm.     ad ----- Message ----- From: Yolonda Kida, LPN Sent: 90/02/8332   3:05 PM EST To: April H Pait, Servando Salina, # Subject: CTA                                            Please schedule at [facility] prior to appt on 11/25/21.  Thanks.

## 2021-11-16 ENCOUNTER — Other Ambulatory Visit: Payer: Self-pay

## 2021-11-16 ENCOUNTER — Ambulatory Visit (HOSPITAL_COMMUNITY)
Admission: RE | Admit: 2021-11-16 | Discharge: 2021-11-16 | Disposition: A | Discharge status: Home / Self Care | Payer: Medicare Other | Source: Ambulatory Visit | Attending: Vascular Surgery | Admitting: Vascular Surgery

## 2021-11-16 DIAGNOSIS — I714 Abdominal aortic aneurysm, without rupture, unspecified: Secondary | ICD-10-CM | POA: Diagnosis not present

## 2021-11-16 LAB — POCT I-STAT CREATININE: Creatinine, Ser: 0.9 mg/dL (ref 0.61–1.24)

## 2021-11-16 MED ORDER — IOHEXOL 350 MG/ML SOLN
80.0000 mL | Freq: Once | INTRAVENOUS | Status: AC | PRN
  Administered 2021-11-16: 80 mL via INTRAVENOUS

## 2021-11-25 ENCOUNTER — Other Ambulatory Visit: Payer: Self-pay

## 2021-11-25 ENCOUNTER — Encounter: Payer: Self-pay | Admitting: Vascular Surgery

## 2021-11-25 ENCOUNTER — Ambulatory Visit (INDEPENDENT_AMBULATORY_CARE_PROVIDER_SITE_OTHER): Payer: Medicare Other | Admitting: Vascular Surgery

## 2021-11-25 VITALS — BP 142/81 | HR 80 | Temp 98.2°F | Resp 20 | Ht 68.0 in | Wt 200.0 lb

## 2021-11-25 DIAGNOSIS — I714 Abdominal aortic aneurysm, without rupture, unspecified: Secondary | ICD-10-CM | POA: Diagnosis not present

## 2021-11-25 NOTE — Progress Notes (Signed)
Patient ID: Charles Hartman, male   DOB: Jun 29, 1946, 75 y.o.   MRN: [id number]  Reason for Consult: Follow-up   Referred by Hamrick, Durward Fortes, MD  Subjective:     HPI:  Charles Hartman is a 75 y.o. male history of abdominal aortic aneurysm first discovered during evaluation for diverticulitis.  He did not previously know of any personal or family history of aneurysm.  He has no new back or abdominal pain and has recovered well from his diverticulitis.  He is not having any issues related to today.  CT scan was performed prior to today's visit.  Past Medical History:  Diagnosis Date   AAA (abdominal aortic aneurysm)    Arthritis    Enlarged prostate    GERD (gastroesophageal reflux disease)    History reviewed. No pertinent family history. Past Surgical History:  Procedure Laterality Date   EUS N/A 01/22/2015   Procedure: ESOPHAGEAL ENDOSCOPIC ULTRASOUND (EUS) RADIAL;  Surgeon: Willis Modena, MD;  Location: [facility];  Service: Endoscopy;  Laterality: N/A;   FINE NEEDLE ASPIRATION N/A 01/22/2015   Procedure: FINE NEEDLE ASPIRATION (FNA) LINEAR;  Surgeon: Willis Modena, MD;  Location: [facility];  Service: Endoscopy;  Laterality: N/A;   HERNIA REPAIR     bilateral inguinal hernia   TRANSURETHRAL RESECTION OF PROSTATE      Short Social History:  Social History   Tobacco Use   Smoking status: Former    Years: 50.00    Types: Cigarettes    Quit date: 10/07/2013    Years since quitting: 8.1   Smokeless tobacco: Never  Substance Use Topics   Alcohol use: No    Allergies  Allergen Reactions   Novocain [Procaine] Anaphylaxis    Current Outpatient Medications  Medication Sig Dispense Refill   aspirin EC 81 MG tablet Take 162 mg by mouth every evening.     dutasteride (AVODART) 0.5 MG capsule Take 0.5 mg by mouth daily.     fenofibrate micronized (LOFIBRA) 134 MG capsule Take 134 mg by mouth every evening.     lovastatin (MEVACOR) 40 MG tablet Take 40 mg by mouth at  bedtime.     RABEprazole (ACIPHEX) 20 MG tablet Take 20 mg by mouth daily.     tamsulosin (FLOMAX) 0.4 MG CAPS capsule Take 0.4 mg by mouth at bedtime.     temazepam (RESTORIL) 15 MG capsule Take 15 mg by mouth at bedtime.     No current facility-administered medications for this visit.    Review of Systems  Constitutional:  Constitutional negative. HENT: HENT negative.  Eyes: Eyes negative.  Respiratory: Respiratory negative.  Cardiovascular: Cardiovascular negative.  GI: Gastrointestinal negative.  Musculoskeletal: Musculoskeletal negative.  Skin: Skin negative.  Neurological: Neurological negative. Hematologic: Hematologic/lymphatic negative.  Psychiatric: Psychiatric negative.       Objective:  Objective   Vitals:   11/25/21 1045  BP: (!) 142/81  Pulse: 80  Resp: 20  Temp: 98.2 F (36.8 C)  SpO2: 91%  Weight: 200 lb (90.7 kg)  Height: 5\' 8"  (1.727 m)   Body mass index is 30.41 kg/m.  Physical Exam HENT:     Head: Normocephalic.     Nose:     Comments: Wearing a mask Eyes:     Pupils: Pupils are equal, round, and reactive to light.  Cardiovascular:     Rate and Rhythm: Normal rate.  Pulmonary:     Effort: Pulmonary effort is normal.  Abdominal:     General: Abdomen is  flat.     Palpations: Abdomen is soft. There is no mass.  Musculoskeletal:        General: Normal range of motion.     Cervical back: Normal range of motion and neck supple.  Skin:    General: Skin is warm and dry.     Capillary Refill: Capillary refill takes less than 2 seconds.  Neurological:     General: No focal deficit present.     Mental Status: He is alert.  Psychiatric:        Mood and Affect: Mood normal.    Data: VASCULAR   1. interval enlargement of now 5.4 cm fusiform AAA, previously 5.2 cm. The patient is followed by a vascular specialist (Dr. Randie Heinz). This recommendation follows ACR consensus guidelines: White Paper of the ACR Incidental Findings Committee II on  Vascular Findings. J Am Coll Radiol 2013; 10:789-794. 2. 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   NON-VASCULAR   1. RIGHT basilar opacity may represent atelectasis, though early aspiration pneumonia could appear similar. 2. Marked prostatomegaly, with mass effect into the urinary bladder. Consider urology referral, if not previously performed. 3. Severe sigmoid diverticulosis. Additional incidental and senescent findings, as above.       Assessment/Plan:    75 year old with what appears to be 5.5 cm fusiform infrarenal abdominal aortic aneurysm measured 5.4 cm by radiology.  I reviewed the CT scan with the patient and his daughter during our visit today and discussed his options being continued watchful waiting, open aneurysm repair versus endovascular aneurysm repair.  I have recommended endovascular repair to decrease the short-term risks with the possibility of further intervention in the near future.  I discussed the risk benefits as well as the above-noted alternatives and the patient and his daughter agreed to proceed.  We will get cardiac clearance and plan to proceed after this.  He can continue aspirin.  All questions were answered.      Charles Harman MD [facility]

## 2021-11-26 ENCOUNTER — Encounter: Payer: Self-pay | Admitting: Internal Medicine

## 2021-11-26 NOTE — Telephone Encounter (Signed)
Error

## 2021-12-02 NOTE — Progress Notes (Signed)
Cardiology Office Note:    Date:  12/03/2021   ID:  Charles Hartman, DOB August 11, 1946, MRN [id number]  PCP:  Ailene Ravel, MD   [facility] Providers Cardiologist:  Alverda Skeans, MD Referring MD: Ailene Ravel, MD   Chief Complaint/Reason for Referral: Preoperative cardiac evaluation for surgery    ASSESSMENT & PLAN:    Preoperative cardiovascular examination - Plan: EKG 12-Lead, ECHOCARDIOGRAM COMPLETE The patient is able to do quite a bit of activity without any symptoms worrisome for angina.  He is able to cut down trees and mow his lawn without any issues.  His EKG does show a right bundle branch block which is incidental.  He has no murmurs on exam.  I will obtain an echocardiogram to evaluate further.  Only if this shows severe LV dysfunction would further cardiac evaluation be needed.  He needs no further ischemic evaluation given the fact that he can do more than 4 METS of activity without any issues.  Follow-up in 1 year or earlier if needed.  Hyperlipidemia, unspecified hyperlipidemia type This is being followed by the patient's primary care provider; goal LDL is less than 100 and less than 70 if possible  Peripheral vascular disease, unspecified (HCC) Continue aspirin and statin.  Will refer for carotid ultrasound screening at next visit      Dispo:  No follow-ups on file.      Medication Adjustments/Labs and Tests Ordered: Current medicines are reviewed at length with the patient today.  Concerns regarding medicines are outlined above.    Tests Ordered: Orders Placed This Encounter  Procedures   EKG 12-Lead   ECHOCARDIOGRAM COMPLETE      Medication Changes: No orders of the defined types were placed in this encounter.    History of Present Illness:    The patient is a 75 y.o. male with the indicated medical history here for cardiac evaluation prior to vascular surgery.  The patient was seen by Dr. Randie Heinz for an incidentally noted abdominal aortic  aneurysm.  Recent CT scanning demonstrated that an enlarging abdominal aortic aneurysm.  There are plans for an intervention with vascular surgery.  The patient is very active.  He cuts down trees on his property with a chainsaw.  He mows his lawn without any issues.  He takes care of all his activities of daily living without exertional angina, shortness of breath, presyncope or syncope.  He denies any peripheral edema, paroxysmal nocturnal dyspnea, orthopnea.  He does have problems lying flat but this is due to postnasal drip.  He is required no hospitalizations or emergency room visits.  He did smoke but quit about 14 or 15 years ago.  He is otherwise well and without complaints.   Previous Medical History: Past Medical History:  Diagnosis Date   AAA (abdominal aortic aneurysm)    Arthritis    Enlarged prostate    GERD (gastroesophageal reflux disease)      Current Medications: Current Meds  Medication Sig   aspirin EC 81 MG tablet Take 162 mg by mouth every evening.   dutasteride (AVODART) 0.5 MG capsule Take 0.5 mg by mouth daily.   fenofibrate micronized (LOFIBRA) 134 MG capsule Take 134 mg by mouth every evening.   lovastatin (MEVACOR) 40 MG tablet Take 40 mg by mouth at bedtime.   RABEprazole (ACIPHEX) 20 MG tablet Take 20 mg by mouth daily.   tamsulosin (FLOMAX) 0.4 MG CAPS capsule Take 0.4 mg by mouth at bedtime.   temazepam (RESTORIL)  15 MG capsule Take 15 mg by mouth at bedtime.     Allergies:    Novocain [procaine]   Social History:   Social History   Tobacco Use   Smoking status: Former    Years: 50.00    Types: Cigarettes    Quit date: 10/07/2013    Years since quitting: 8.1   Smokeless tobacco: Never  Vaping Use   Vaping Use: Never used  Substance Use Topics   Alcohol use: No   Drug use: No     Family Hx: History reviewed. No pertinent family history.   Review of Systems:   Please see the history of present illness.    All other systems reviewed and  are negative.  EKGs/Labs/Other Test Reviewed:    EKG:  EKG today: Sinus rhythm with right bundle branch block  Prior CV studies: None  Imaging studies that I have independently reviewed today:   1. Idnterval enlargement of now 5.4 cm fusiform AAA, previously 5.2 cm. The patient is followed by a vascular specialist (Dr. Randie Heinz). This recommendation follows ACR consensus guidelines: White Paper of the ACR Incidental Findings Committee II on Vascular Findings. J Am Coll Radiol 2013; 10:789-794. 2. 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   NON-VASCULAR   1. RIGHT basilar opacity may represent atelectasis, though early aspiration pneumonia could appear similar. 2. Marked prostatomegaly, with mass effect into the urinary bladder. Consider urology referral, if not previously performed. 3. Severe sigmoid diverticulosis. Additional incidental and senescent findings, as above.  Recent Labs: 11/16/2021: Creatinine, Ser 0.90   Recent Lipid Panel No results found for: CHOL, TRIG, HDL, LDLCALC, LDLDIRECT   Risk Assessment/Calculations:          Physical Exam:    VS:  BP (!) 142/78   Pulse 74   Ht 5' 8.5" (1.74 m)   Wt 188 lb (85.3 kg)   SpO2 94%   BMI 28.17 kg/m    Wt Readings from Last 3 Encounters:  12/03/21 188 lb (85.3 kg)  11/25/21 200 lb (90.7 kg)  05/29/21 195 lb (88.5 kg)    GENERAL:  No apparent distress, AOx3 HEENT:  No carotid bruits, +2 carotid impulses, no scleral icterus CAR: RRR, no murmurs, gallops, rubs, or thrills RES:  Clear to auscultation bilaterally ABD:  Soft, nontender, nondistended, positive bowel sounds x 4 VASC:  +2 radial pulses, +2 carotid pulses, palpable pedal pulses NEURO:  CN 2-12 grossly intact; motor and sensory grossly intact PSYCH:  No active depression or anxiety EXT:  No edema, ecchymosis, or cyanosis    Signed, Orbie Pyo, MD  12/03/2021 2:11 PM    [facility] [address] [place],  [place], [place]  [address] Phone: [phone]; Fax: [phone]

## 2021-12-03 ENCOUNTER — Encounter: Payer: Self-pay | Admitting: Internal Medicine

## 2021-12-03 ENCOUNTER — Ambulatory Visit (INDEPENDENT_AMBULATORY_CARE_PROVIDER_SITE_OTHER): Payer: Medicare Other | Admitting: Internal Medicine

## 2021-12-03 ENCOUNTER — Other Ambulatory Visit: Payer: Self-pay

## 2021-12-03 VITALS — BP 142/78 | HR 74 | Ht 68.5 in | Wt 188.0 lb

## 2021-12-03 DIAGNOSIS — I739 Peripheral vascular disease, unspecified: Secondary | ICD-10-CM

## 2021-12-03 DIAGNOSIS — E785 Hyperlipidemia, unspecified: Secondary | ICD-10-CM | POA: Diagnosis not present

## 2021-12-03 DIAGNOSIS — Z01810 Encounter for preprocedural cardiovascular examination: Secondary | ICD-10-CM

## 2021-12-03 NOTE — Patient Instructions (Signed)
Medication Instructions:  No changes *If you need a refill on your cardiac medications before your next appointment, please call your pharmacy*   Lab Work: none   Testing/Procedures: Your physician has requested that you have an echocardiogram. Echocardiography is a painless test that uses sound waves to create images of your heart. It provides your doctor with information about the size and shape of your heart and how well your heart's chambers and valves are working. This procedure takes approximately one hour. There are no restrictions for this procedure.   Follow-Up: Your physician wants you to follow-up in: ONE YEAR with Dr. Nolon Nations will receive a reminder letter in the mail two months in advance. If you don't receive a letter, please call our office to schedule the follow-up appointment.   Other Instructions

## 2021-12-22 ENCOUNTER — Other Ambulatory Visit: Payer: Self-pay

## 2021-12-22 ENCOUNTER — Ambulatory Visit (INDEPENDENT_AMBULATORY_CARE_PROVIDER_SITE_OTHER): Payer: Medicare Other

## 2021-12-22 DIAGNOSIS — Z01810 Encounter for preprocedural cardiovascular examination: Secondary | ICD-10-CM | POA: Diagnosis not present

## 2021-12-23 LAB — ECHOCARDIOGRAM COMPLETE
Area-P 1/2: 2.21 cm2
S' Lateral: 3.8 cm

## 2021-12-25 ENCOUNTER — Other Ambulatory Visit: Payer: Self-pay

## 2021-12-25 DIAGNOSIS — I714 Abdominal aortic aneurysm, without rupture, unspecified: Secondary | ICD-10-CM

## 2022-02-01 NOTE — Progress Notes (Signed)
Surgical Instructions   Your procedure is scheduled on Friday 02/05/2022.  Report to [facility] Main Entrance "A" at 05:30 A.M., then check in with the Admitting office.  Call [phone] if you have problems or questions between now and the morning of surgery:   Remember: Do not eat or drink after midnight the night before your surgery    Take these medicines the morning of surgery with A SIP OF WATER:  Dutasteride (Avodart) Fenofibrate micronized (Lofibra) Mometasone (Nasonex) nasal spray Rabeprazole (Aciphex)   If needed you may take these medications the morning of surgery: Tetrahydrozoline HCl (Visine OP) eye drops   As of today, STOP taking any Aspirin (unless otherwise instructed by your surgeon) or Aspirin-containing products; NSAIDS - Aleve, Naproxen, Ibuprofen, Motrin, Advil, Goody's, BC's, all herbal medications, fish oil, and all vitamins.   Follow your surgeon's instructions on when to stop Aspirin.  If no instructions were given by your surgeon then you will need to call the office to get those instructions.     After your pre-procedure COVID test  You are not required to quarantine however you are required to wear a well-fitting mask when you are out and around people not in your household.  If your mask becomes wet or soiled, replace with a new one.  Wash your hands often with soap and water for 20 seconds or clean your hands with an alcohol-based hand sanitizer that contains at least 60% alcohol.  Do not share personal items.  Notify your provider: if you are in close contact with someone who has COVID  or if you develop a fever of 100.4 or greater, sneezing, cough, sore throat, shortness of breath or body aches.          Do not wear jewelry   Do not wear lotions, powders, colognes, or deodorant.  Do not shave 48 hours prior to surgery.  Men may shave face and neck.  Do not bring valuables to the hospital - [facility] is not responsible for any  belongings or valuables.  Do NOT Smoke (Tobacco/Vaping) or drink Alcohol 24 hours prior to your procedure  If you use a CPAP at night, please bring your mask for your overnight stay.   Contacts, glasses, hearing aids, dentures or partials may not be worn into surgery, please bring cases for these belongings   For patients admitted to the hospital, discharge time will be determined by your treatment team.   Patients discharged the day of surgery will not be allowed to drive home, and someone needs to stay with them for 24 hours.  NO VISITORS WILL BE ALLOWED IN PRE-OP WHERE PATIENTS ARE PREPPED FOR SURGERY.  ONLY 1 SUPPORT PERSON MAY BE PRESENT IN THE WAITING ROOM WHILE YOU ARE IN SURGERY.  IF YOU ARE TO BE ADMITTED, ONCE YOU ARE IN YOUR ROOM YOU WILL BE ALLOWED TWO (2) VISITORS. 1 (ONE) VISITOR MAY STAY OVERNIGHT BUT MUST ARRIVE TO THE ROOM BY 8pm.  Minor children may have two parents present. Special consideration for safety and communication needs will be reviewed on a case by case basis.  Special instructions:    Oral Hygiene is also important to reduce your risk of infection.  Remember - BRUSH YOUR TEETH THE MORNING OF SURGERY WITH YOUR REGULAR TOOTHPASTE   [place]- Preparing For Surgery  Before surgery, you can play an important role. Because skin is not sterile, your skin needs to be as free of germs as possible. You can reduce the number  of germs on your skin by washing with CHG (chlorahexidine gluconate) Soap before surgery.  CHG is an antiseptic cleaner which kills germs and bonds with the skin to continue killing germs even after washing.     Please do not use if you have an allergy to CHG or antibacterial soaps. If your skin becomes reddened/irritated stop using the CHG.  Do not shave (including legs and underarms) for at least 48 hours prior to first CHG shower. It is OK to shave your face.  Please follow these instructions carefully.     Shower the NIGHT BEFORE SURGERY  and the MORNING OF SURGERY with CHG Soap.   If you chose to wash your hair, wash your hair first as usual with your normal shampoo. After you shampoo, rinse your hair and body thoroughly to remove the shampoo.    Then [employer] and genitals (private parts) with your normal soap and rinse thoroughly to remove soap.  Next use the CHG Soap as you would any other liquid soap. You can apply CHG directly to the skin and wash gently with a clean washcloth.   Apply the CHG Soap to your body ONLY FROM THE NECK DOWN.  Do not use on open wounds or open sores. Avoid contact with your eyes, ears, mouth and genitals (private parts). Wash Face and genitals (private parts)  with your normal soap.   Wash thoroughly, paying special attention to the area where your surgery will be performed.  Thoroughly rinse your body with warm water from the neck down.  DO NOT shower/wash with your normal soap after using and rinsing off the CHG Soap.  Pat yourself dry with a CLEAN TOWEL.  Wear CLEAN PAJAMAS to bed the night before surgery  Place CLEAN SHEETS on your bed the night before your surgery  DO NOT SLEEP WITH PETS.   Day of Surgery:  Take a shower with CHG soap. Wear Clean/Comfortable clothing the morning of surgery Do not apply any deodorants/lotions.   Remember to brush your teeth WITH YOUR REGULAR TOOTHPASTE.   Please read over the fact sheets that you were given.

## 2022-02-02 ENCOUNTER — Encounter (HOSPITAL_COMMUNITY): Payer: Self-pay

## 2022-02-02 ENCOUNTER — Encounter (HOSPITAL_COMMUNITY)
Admission: RE | Admit: 2022-02-02 | Discharge: 2022-02-02 | Disposition: A | Discharge status: Home / Self Care | Payer: Medicare Other | Source: Ambulatory Visit | Attending: Vascular Surgery | Admitting: Vascular Surgery

## 2022-02-02 ENCOUNTER — Other Ambulatory Visit: Payer: Self-pay

## 2022-02-02 VITALS — BP 141/80 | HR 85 | Temp 98.2°F | Resp 17 | Ht 68.0 in | Wt 205.9 lb

## 2022-02-02 DIAGNOSIS — U071 COVID-19: Secondary | ICD-10-CM | POA: Diagnosis not present

## 2022-02-02 DIAGNOSIS — I714 Abdominal aortic aneurysm, without rupture, unspecified: Secondary | ICD-10-CM | POA: Diagnosis not present

## 2022-02-02 DIAGNOSIS — R7303 Prediabetes: Secondary | ICD-10-CM | POA: Insufficient documentation

## 2022-02-02 DIAGNOSIS — Z01818 Encounter for other preprocedural examination: Secondary | ICD-10-CM

## 2022-02-02 DIAGNOSIS — Z01812 Encounter for preprocedural laboratory examination: Secondary | ICD-10-CM | POA: Diagnosis not present

## 2022-02-02 LAB — CBC
HCT: 45.1 % (ref 39.0–52.0)
Hemoglobin: 15.3 g/dL (ref 13.0–17.0)
MCH: 28.3 pg (ref 26.0–34.0)
MCHC: 33.9 g/dL (ref 30.0–36.0)
MCV: 83.4 fL (ref 80.0–100.0)
Platelets: 293 10*3/uL (ref 150–400)
RBC: 5.41 MIL/uL (ref 4.22–5.81)
RDW: 14.1 % (ref 11.5–15.5)
WBC: 9 10*3/uL (ref 4.0–10.5)
nRBC: 0 % (ref 0.0–0.2)

## 2022-02-02 LAB — URINALYSIS, ROUTINE W REFLEX MICROSCOPIC
Bilirubin Urine: NEGATIVE
Glucose, UA: 50 mg/dL — AB
Hgb urine dipstick: NEGATIVE
Ketones, ur: NEGATIVE mg/dL
Nitrite: NEGATIVE
Protein, ur: NEGATIVE mg/dL
Specific Gravity, Urine: 1.008 (ref 1.005–1.030)
pH: 8 (ref 5.0–8.0)

## 2022-02-02 LAB — HEMOGLOBIN A1C
Hgb A1c MFr Bld: 7.1 % — ABNORMAL HIGH (ref 4.8–5.6)
Mean Plasma Glucose: 157.07 mg/dL

## 2022-02-02 LAB — TYPE AND SCREEN
ABO/RH(D): A POS
Antibody Screen: NEGATIVE

## 2022-02-02 LAB — COMPREHENSIVE METABOLIC PANEL
ALT: 15 U/L (ref 0–44)
AST: 30 U/L (ref 15–41)
Albumin: 4.1 g/dL (ref 3.5–5.0)
Alkaline Phosphatase: 45 U/L (ref 38–126)
Anion gap: 7 (ref 5–15)
BUN: 12 mg/dL (ref 8–23)
CO2: 24 mmol/L (ref 22–32)
Calcium: 9.6 mg/dL (ref 8.9–10.3)
Chloride: 102 mmol/L (ref 98–111)
Creatinine, Ser: 0.98 mg/dL (ref 0.61–1.24)
GFR, Estimated: >60 mL/min (ref >60)
Glucose, Bld: 127 mg/dL — ABNORMAL HIGH (ref 70–99)
Potassium: 4 mmol/L (ref 3.5–5.1)
Sodium: 133 mmol/L — ABNORMAL LOW (ref 135–145)
Total Bilirubin: 0.6 mg/dL (ref 0.3–1.2)
Total Protein: 7.6 g/dL (ref 6.5–8.1)

## 2022-02-02 LAB — SARS CORONAVIRUS 2 BY RT PCR (HOSPITAL ORDER, PERFORMED IN ~~LOC~~ HOSPITAL LAB): SARS Coronavirus 2: POSITIVE — AB

## 2022-02-02 LAB — GLUCOSE, CAPILLARY: Glucose-Capillary: 154 mg/dL — ABNORMAL HIGH (ref 70–99)

## 2022-02-02 LAB — SURGICAL PCR SCREEN
MRSA, PCR: NEGATIVE
Staphylococcus aureus: NEGATIVE

## 2022-02-02 NOTE — Progress Notes (Addendum)
Pt came to appointment with cold-like symptoms. Pt stated [place] the mountains and became sick upon returning. Pt states 5-6 other family members are having the same symptoms since the trip. Appt today was completed expeditiously only completing covid test, PCR test, labs and UA. Pt did sign consents, received pre-op instructions and CHG soap. History also completed with pt.   PCP - Dr. Burnell Blanks Cardiologist - denies  PPM/ICD - n/a  Chest x-ray - n/a EKG - 12/03/21 Stress Test - denies ECHO - 12/22/21 Cardiac Cath - denies  Sleep Study - denies CPAP - denies  Pt states he is pre-diabetic. CBG at PAT 154. A1C collected.   Blood Thinner Instructions: n/a Aspirin Instructions: Cont per VVS protocol.   NPO at MD  COVID TEST- Rapid collected 02/02/22 d/t pt being symptomatic.   Anesthesia review: Yes, cardiac clearance 12/03/21  Patient denies shortness of breath, fever, or chest pain at PAT appointment   All instructions explained to the patient, with a verbal understanding of the material. Patient agrees to go over the instructions while at home for a better understanding. Patient also instructed to self quarantine after being tested for COVID-19. The opportunity to ask questions was provided.

## 2022-02-02 NOTE — Progress Notes (Signed)
Surgical Instructions   Your procedure is scheduled on Friday 02/05/2022.  Report to [facility] Main Entrance "A" at 05:30 A.M., then check in with the Admitting office.  Call [phone] if you have problems or questions between now and the morning of surgery:   Remember: Do not eat or drink after midnight the night before your surgery    Take these medicines the morning of surgery with A SIP OF WATER:  Dutasteride (Avodart) Fenofibrate micronized (Lofibra) Mometasone (Nasonex) nasal spray Rabeprazole (Aciphex)   If needed you may take these medications the morning of surgery: Tetrahydrozoline HCl (Visine OP) eye drops   As of today, STOP taking any NSAIDS - Aleve, Naproxen, Ibuprofen, Motrin, Advil, Goody's, BC's, all herbal medications, fish oil, and all vitamins.   After your pre-procedure COVID test  You are not required to quarantine however you are required to wear a well-fitting mask when you are out and around people not in your household.  If your mask becomes wet or soiled, replace with a new one.  Wash your hands often with soap and water for 20 seconds or clean your hands with an alcohol-based hand sanitizer that contains at least 60% alcohol.  Do not share personal items.  Notify your provider: if you are in close contact with someone who has COVID  or if you develop a fever of 100.4 or greater, sneezing, cough, sore throat, shortness of breath or body aches.          Do not wear jewelry   Do not wear lotions, powders, colognes, or deodorant.  Do not shave 48 hours prior to surgery.  Men may shave face and neck.  Do not bring valuables to the hospital - [facility] is not responsible for any belongings or valuables.  Do NOT Smoke (Tobacco/Vaping) or drink Alcohol 24 hours prior to your procedure  If you use a CPAP at night, please bring your mask for your overnight stay.   Contacts, glasses, hearing aids, dentures or partials may not be worn into  surgery, please bring cases for these belongings   For patients admitted to the hospital, discharge time will be determined by your treatment team.   Patients discharged the day of surgery will not be allowed to drive home, and someone needs to stay with them for 24 hours.  NO VISITORS WILL BE ALLOWED IN PRE-OP WHERE PATIENTS ARE PREPPED FOR SURGERY.  ONLY 1 SUPPORT PERSON MAY BE PRESENT IN THE WAITING ROOM WHILE YOU ARE IN SURGERY.  IF YOU ARE TO BE ADMITTED, ONCE YOU ARE IN YOUR ROOM YOU WILL BE ALLOWED TWO (2) VISITORS. 1 (ONE) VISITOR MAY STAY OVERNIGHT BUT MUST ARRIVE TO THE ROOM BY 8pm.  Minor children may have two parents present. Special consideration for safety and communication needs will be reviewed on a case by case basis.  Special instructions:    Oral Hygiene is also important to reduce your risk of infection.  Remember - BRUSH YOUR TEETH THE MORNING OF SURGERY WITH YOUR REGULAR TOOTHPASTE   [place]- Preparing For Surgery  Before surgery, you can play an important role. Because skin is not sterile, your skin needs to be as free of germs as possible. You can reduce the number of germs on your skin by washing with CHG (chlorahexidine gluconate) Soap before surgery.  CHG is an antiseptic cleaner which kills germs and bonds with the skin to continue killing germs even after washing.     Please do not use if  you have an allergy to CHG or antibacterial soaps. If your skin becomes reddened/irritated stop using the CHG.  Do not shave (including legs and underarms) for at least 48 hours prior to first CHG shower. It is OK to shave your face.  Please follow these instructions carefully.     Shower the NIGHT BEFORE SURGERY and the MORNING OF SURGERY with CHG Soap.   If you chose to wash your hair, wash your hair first as usual with your normal shampoo. After you shampoo, rinse your hair and body thoroughly to remove the shampoo.    Then [employer] and genitals (private parts) with  your normal soap and rinse thoroughly to remove soap.  Next use the CHG Soap as you would any other liquid soap. You can apply CHG directly to the skin and wash gently with a clean washcloth.   Apply the CHG Soap to your body ONLY FROM THE NECK DOWN.  Do not use on open wounds or open sores. Avoid contact with your eyes, ears, mouth and genitals (private parts). Wash Face and genitals (private parts)  with your normal soap.   Wash thoroughly, paying special attention to the area where your surgery will be performed.  Thoroughly rinse your body with warm water from the neck down.  DO NOT shower/wash with your normal soap after using and rinsing off the CHG Soap.  Pat yourself dry with a CLEAN TOWEL.  Wear CLEAN PAJAMAS to bed the night before surgery  Place CLEAN SHEETS on your bed the night before your surgery  DO NOT SLEEP WITH PETS.   Day of Surgery:  Take a shower with CHG soap. Wear Clean/Comfortable clothing the morning of surgery Do not apply any deodorants/lotions.   Remember to brush your teeth WITH YOUR REGULAR TOOTHPASTE.   Please read over the fact sheets that you were given.

## 2022-02-02 NOTE — Progress Notes (Signed)
Spoke with Izora Gala, surgical scheduler at [facility] regarding pt's positive covid result.  Jacqlyn Larsen, RN

## 2022-02-24 ENCOUNTER — Encounter (HOSPITAL_COMMUNITY): Payer: Self-pay | Admitting: Vascular Surgery

## 2022-02-24 ENCOUNTER — Encounter (HOSPITAL_COMMUNITY): Payer: Self-pay

## 2022-02-24 NOTE — Anesthesia Preprocedure Evaluation (Addendum)
Anesthesia Evaluation  ?Patient identified by MRN, date of birth, ID band ?Patient awake ? ? ? ?Reviewed: ?Allergy & Precautions, NPO status , Patient's Chart, lab work & pertinent test results ? ?History of Anesthesia Complications ?Negative for: history of anesthetic complications ? ?Airway ?Mallampati: II ? ?TM Distance: >3 FB ?Neck ROM: Full ? ? ? Dental ? ?(+) Edentulous Lower, Edentulous Upper ?  ?Pulmonary ?former smoker,  ?  ?Pulmonary exam normal ? ? ? ? ? ? ? Cardiovascular ?+ Peripheral Vascular Disease  ?Normal cardiovascular exam ? ? ?'22 TTE - EF 60 to 65%. Grade I diastolic dysfunction (impaired relaxation). Left atrial size was mildly dilated.  ? ?  ?Neuro/Psych ?negative neurological ROS ? negative psych ROS  ? GI/Hepatic ?Neg liver ROS, GERD  Medicated and Controlled,  ?Endo/Other  ? ?Obesity ?Pre-DM ? ? Renal/GU ?negative Renal ROS  ? ?  ?Musculoskeletal ? ?(+) Arthritis ,  ? Abdominal ?  ?Peds ? Hematology ?negative hematology ROS ?(+)   ?Anesthesia Other Findings ? ? Reproductive/Obstetrics ? ?  ? ? ? ? ? ? ? ? ? ? ? ? ? ?  ?  ? ? ? ? ? ? ?Anesthesia Physical ?Anesthesia Plan ? ?ASA: 3 ? ?Anesthesia Plan: General  ? ?Post-op Pain Management: Tylenol PO (pre-op)*  ? ?Induction: Intravenous ? ?PONV Risk Score and Plan: 2 and Treatment may vary due to age or medical condition, Ondansetron and Dexamethasone ? ?Airway Management Planned: Oral ETT ? ?Additional Equipment: None ? ?Intra-op Plan:  ? ?Post-operative Plan: Extubation in OR ? ?Informed Consent: I have reviewed the patients History and Physical, chart, labs and discussed the procedure including the risks, benefits and alternatives for the proposed anesthesia with the patient or authorized representative who has indicated his/her understanding and acceptance.  ? ? ? ?Dental advisory given ? ?Plan Discussed with: CRNA and Anesthesiologist ? ?Anesthesia Plan Comments:   ? ? ? ?Anesthesia Quick Evaluation ? ?

## 2022-02-24 NOTE — Progress Notes (Signed)
Anesthesia Chart Review:  Case previously scheduled for 02/05/2022 but was postponed due to positive preop COVID test.  Patient seen by cardiologist Dr. Ali Lowe for preop eval. Per note 12/03/21, "preoperative cardiovascular examination - Plan: EKG 12-Lead, ECHOCARDIOGRAM COMPLETE. The patient is able to do quite a bit of activity without any symptoms worrisome for angina.  He is able to cut down trees and mow his lawn without any issues.  His EKG does show a right bundle branch block which is incidental.  He has no murmurs on exam.  I will obtain an echocardiogram to evaluate further.  Only if this shows severe LV dysfunction would further cardiac evaluation be needed.  He needs no further ischemic evaluation given the fact that he can do more than 4 METS of activity without any issues.  Follow-up in 1 year or earlier if needed."  Echo 12/23/2019 showed normal LVEF 60 to 65%, grade 1 DD, normal wall motion, normal valves.  Dr. Ali Lowe commented on result stating, "Let him know echo shows the heart and valves are working well, nothing further is needed prior to surgery."  DM2 reasonably well-controlled with A1c 7.1 on 02/02/2022.  CMP and CBC from 02/02/2022 reviewed, mild hyponatremia sodium 133, otherwise unremarkable.  Patient will need day of surgery evaluation.  EKG 12/03/2021: Sinus rhythm.  Right bundle branch block.  Rate 70.  TTE 12/22/2021:  1. Left ventricular ejection fraction, by estimation, is 60 to 65%. The  left ventricle has normal function. The left ventricle has no regional  wall motion abnormalities. Left ventricular diastolic parameters are  consistent with Grade I diastolic  dysfunction (impaired relaxation).   2. Right ventricular systolic function is normal. The right ventricular  size is normal. There is normal pulmonary artery systolic pressure.   3. Left atrial size was mildly dilated.   4. The mitral valve is normal in structure. No evidence of mitral valve   regurgitation. No evidence of mitral stenosis.   5. The aortic valve is normal in structure. Aortic valve regurgitation is  not visualized. No aortic stenosis is present.   6. The inferior vena cava is normal in size with greater than 50%  respiratory variability, suggesting right atrial pressure of 3 mmHg.     Wynonia Musty [facility]/Anesthesiology Phone [phone] 02/24/2022 12:21 PM

## 2022-02-24 NOTE — Progress Notes (Signed)
Spoke with patient and reviewed instructions for DOS.  Patient had a positive Covid test on 02/02/22 but currently does not have any Covid symptoms.  Patient to arrive at 0530 on Friday, 02/26/22. ?

## 2022-02-26 ENCOUNTER — Inpatient Hospital Stay (HOSPITAL_COMMUNITY): Payer: Medicare Other | Admitting: Certified Registered Nurse Anesthetist

## 2022-02-26 ENCOUNTER — Other Ambulatory Visit: Payer: Self-pay

## 2022-02-26 ENCOUNTER — Inpatient Hospital Stay (HOSPITAL_COMMUNITY): Payer: Medicare Other | Admitting: Anesthesiology

## 2022-02-26 ENCOUNTER — Encounter (HOSPITAL_COMMUNITY): Payer: Self-pay | Admitting: Vascular Surgery

## 2022-02-26 ENCOUNTER — Inpatient Hospital Stay (HOSPITAL_COMMUNITY)
Admission: RE | Admit: 2022-02-26 | Discharge: 2022-02-27 | DRG: 269 | Disposition: A | Discharge status: Home / Self Care | Payer: Medicare Other | Attending: Vascular Surgery | Admitting: Vascular Surgery

## 2022-02-26 ENCOUNTER — Encounter (HOSPITAL_COMMUNITY)
Admission: RE | Disposition: A | Discharge status: Home / Self Care | Payer: Self-pay | Source: Home / Self Care | Attending: Vascular Surgery

## 2022-02-26 ENCOUNTER — Inpatient Hospital Stay (HOSPITAL_COMMUNITY): Payer: Medicare Other

## 2022-02-26 ENCOUNTER — Inpatient Hospital Stay (HOSPITAL_COMMUNITY): Payer: Medicare Other | Admitting: Physician Assistant

## 2022-02-26 DIAGNOSIS — Z8616 Personal history of COVID-19: Secondary | ICD-10-CM | POA: Diagnosis not present

## 2022-02-26 DIAGNOSIS — I998 Other disorder of circulatory system: Secondary | ICD-10-CM | POA: Diagnosis present

## 2022-02-26 DIAGNOSIS — N400 Enlarged prostate without lower urinary tract symptoms: Secondary | ICD-10-CM | POA: Diagnosis present

## 2022-02-26 DIAGNOSIS — I9789 Other postprocedural complications and disorders of the circulatory system, not elsewhere classified: Secondary | ICD-10-CM | POA: Diagnosis not present

## 2022-02-26 DIAGNOSIS — I714 Abdominal aortic aneurysm, without rupture, unspecified: Secondary | ICD-10-CM

## 2022-02-26 DIAGNOSIS — Z87891 Personal history of nicotine dependence: Secondary | ICD-10-CM

## 2022-02-26 DIAGNOSIS — I739 Peripheral vascular disease, unspecified: Secondary | ICD-10-CM

## 2022-02-26 DIAGNOSIS — K219 Gastro-esophageal reflux disease without esophagitis: Secondary | ICD-10-CM | POA: Diagnosis present

## 2022-02-26 DIAGNOSIS — I743 Embolism and thrombosis of arteries of the lower extremities: Secondary | ICD-10-CM | POA: Diagnosis present

## 2022-02-26 DIAGNOSIS — Z7982 Long term (current) use of aspirin: Secondary | ICD-10-CM

## 2022-02-26 DIAGNOSIS — M1990 Unspecified osteoarthritis, unspecified site: Secondary | ICD-10-CM | POA: Diagnosis not present

## 2022-02-26 DIAGNOSIS — Z79899 Other long term (current) drug therapy: Secondary | ICD-10-CM | POA: Diagnosis not present

## 2022-02-26 DIAGNOSIS — Z8679 Personal history of other diseases of the circulatory system: Secondary | ICD-10-CM

## 2022-02-26 DIAGNOSIS — R7303 Prediabetes: Secondary | ICD-10-CM

## 2022-02-26 DIAGNOSIS — Z885 Allergy status to narcotic agent status: Secondary | ICD-10-CM | POA: Diagnosis not present

## 2022-02-26 HISTORY — PX: ENDARTERECTOMY FEMORAL: SHX5804

## 2022-02-26 HISTORY — PX: ULTRASOUND GUIDANCE FOR VASCULAR ACCESS: SHX6516

## 2022-02-26 HISTORY — PX: THROMBECTOMY FEMORAL ARTERY: SHX6406

## 2022-02-26 HISTORY — PX: PATCH ANGIOPLASTY: SHX6230

## 2022-02-26 HISTORY — PX: ABDOMINAL AORTIC ENDOVASCULAR STENT GRAFT: SHX5707

## 2022-02-26 LAB — BASIC METABOLIC PANEL
Anion gap: 9 (ref 5–15)
BUN: 11 mg/dL (ref 8–23)
CO2: 23 mmol/L (ref 22–32)
Calcium: 8.8 mg/dL — ABNORMAL LOW (ref 8.9–10.3)
Chloride: 106 mmol/L (ref 98–111)
Creatinine, Ser: 0.96 mg/dL (ref 0.61–1.24)
GFR, Estimated: >60 mL/min (ref >60)
Glucose, Bld: 144 mg/dL — ABNORMAL HIGH (ref 70–99)
Potassium: 3.7 mmol/L (ref 3.5–5.1)
Sodium: 138 mmol/L (ref 135–145)

## 2022-02-26 LAB — TYPE AND SCREEN
ABO/RH(D): A POS
Antibody Screen: NEGATIVE

## 2022-02-26 LAB — MAGNESIUM: Magnesium: 1.8 mg/dL (ref 1.7–2.4)

## 2022-02-26 LAB — PROTIME-INR
INR: 1.3 — ABNORMAL HIGH (ref 0.8–1.2)
Prothrombin Time: 16 seconds — ABNORMAL HIGH (ref 11.4–15.2)

## 2022-02-26 LAB — CBC
HCT: 40.1 % (ref 39.0–52.0)
Hemoglobin: 13.1 g/dL (ref 13.0–17.0)
MCH: 27.6 pg (ref 26.0–34.0)
MCHC: 32.7 g/dL (ref 30.0–36.0)
MCV: 84.4 fL (ref 80.0–100.0)
Platelets: 289 10*3/uL (ref 150–400)
RBC: 4.75 MIL/uL (ref 4.22–5.81)
RDW: 14.2 % (ref 11.5–15.5)
WBC: 9.3 10*3/uL (ref 4.0–10.5)
nRBC: 0 % (ref 0.0–0.2)

## 2022-02-26 LAB — POCT ACTIVATED CLOTTING TIME
Activated Clotting Time: 203 seconds
Activated Clotting Time: 239 seconds

## 2022-02-26 LAB — APTT: aPTT: 151 seconds — ABNORMAL HIGH (ref 24–36)

## 2022-02-26 SURGERY — THROMBECTOMY, ARTERY, FEMORAL
Anesthesia: General | Site: Groin | Laterality: Right

## 2022-02-26 SURGERY — INSERTION, ENDOVASCULAR STENT GRAFT, AORTA, ABDOMINAL
Anesthesia: General | Site: Groin

## 2022-02-26 MED ORDER — CEFAZOLIN SODIUM-DEXTROSE 2-4 GM/100ML-% IV SOLN
2.0000 g | INTRAVENOUS | Status: AC
  Administered 2022-02-26: 2 g via INTRAVENOUS

## 2022-02-26 MED ORDER — PRAVASTATIN SODIUM 40 MG PO TABS: 40.0000 mg | ORAL_TABLET | Freq: Every day | ORAL | Status: DC

## 2022-02-26 MED ORDER — FENTANYL CITRATE (PF) 100 MCG/2ML IJ SOLN
INTRAMUSCULAR | Status: DC | PRN
  Administered 2022-02-26: 50 ug via INTRAVENOUS

## 2022-02-26 MED ORDER — CISATRACURIUM BESYLATE (PF) 10 MG/5ML IV SOLN
INTRAVENOUS | Status: DC | PRN
  Administered 2022-02-26 (×2): 2 mg via INTRAVENOUS

## 2022-02-26 MED ORDER — MAGNESIUM SULFATE 2 GM/50ML IV SOLN: 2.0000 g | Freq: Every day | INTRAVENOUS | Status: DC | PRN

## 2022-02-26 MED ORDER — ACETAMINOPHEN 500 MG PO TABS
ORAL_TABLET | ORAL | Status: AC
  Administered 2022-02-26: 1000 mg via ORAL
  Filled 2022-02-26: qty 2

## 2022-02-26 MED ORDER — PHENYLEPHRINE HCL-NACL 20-0.9 MG/250ML-% IV SOLN
INTRAVENOUS | Status: DC | PRN
Start: 2022-02-26 — End: 2022-02-26
  Administered 2022-02-26: 30 ug/min via INTRAVENOUS

## 2022-02-26 MED ORDER — SODIUM CHLORIDE 0.9 % IV SOLN: INTRAVENOUS | Status: DC

## 2022-02-26 MED ORDER — HEPARIN SODIUM (PORCINE) 5000 UNIT/ML IJ SOLN: 5000.0000 [IU] | Freq: Three times a day (TID) | INTRAMUSCULAR | Status: DC

## 2022-02-26 MED ORDER — PANTOPRAZOLE SODIUM 40 MG PO TBEC
40.0000 mg | DELAYED_RELEASE_TABLET | Freq: Every day | ORAL | Status: DC
  Administered 2022-02-27: 40 mg via ORAL
  Filled 2022-02-26: qty 1

## 2022-02-26 MED ORDER — BISACODYL 10 MG RE SUPP: 10.0000 mg | Freq: Every day | RECTAL | Status: DC | PRN

## 2022-02-26 MED ORDER — GLYCOPYRROLATE PF 0.2 MG/ML IJ SOSY
PREFILLED_SYRINGE | INTRAMUSCULAR | Status: DC | PRN
  Administered 2022-02-26: .2 mg via INTRAVENOUS
  Administered 2022-02-26: .4 mg via INTRAVENOUS

## 2022-02-26 MED ORDER — HEPARIN 6000 UNIT IRRIGATION SOLUTION
Status: AC
  Filled 2022-02-26: qty 500

## 2022-02-26 MED ORDER — 0.9 % SODIUM CHLORIDE (POUR BTL) OPTIME
TOPICAL | Status: DC | PRN
  Administered 2022-02-26: 1000 mL

## 2022-02-26 MED ORDER — ASPIRIN EC 81 MG PO TBEC
162.0000 mg | DELAYED_RELEASE_TABLET | Freq: Every evening | ORAL | Status: DC
  Administered 2022-02-26: 162 mg via ORAL
  Filled 2022-02-26: qty 2

## 2022-02-26 MED ORDER — EPHEDRINE SULFATE (PRESSORS) 50 MG/ML IJ SOLN
INTRAMUSCULAR | Status: DC | PRN
  Administered 2022-02-26: 10 mg via INTRAVENOUS

## 2022-02-26 MED ORDER — FENTANYL CITRATE (PF) 250 MCG/5ML IJ SOLN
INTRAMUSCULAR | Status: AC
Start: 2022-02-26 — End: ?
  Filled 2022-02-26: qty 5

## 2022-02-26 MED ORDER — MIDAZOLAM HCL 2 MG/2ML IJ SOLN
INTRAMUSCULAR | Status: AC
  Filled 2022-02-26: qty 2

## 2022-02-26 MED ORDER — ACETAMINOPHEN 325 MG PO TABS
325.0000 mg | ORAL_TABLET | ORAL | Status: DC | PRN
  Administered 2022-02-26: 650 mg via ORAL
  Filled 2022-02-26: qty 2

## 2022-02-26 MED ORDER — LIDOCAINE 2% (20 MG/ML) 5 ML SYRINGE
INTRAMUSCULAR | Status: DC | PRN
  Administered 2022-02-26: 60 mg via INTRAVENOUS

## 2022-02-26 MED ORDER — HEPARIN SODIUM (PORCINE) 1000 UNIT/ML IJ SOLN
INTRAMUSCULAR | Status: DC | PRN
  Administered 2022-02-26: 3000 [IU] via INTRAVENOUS
  Administered 2022-02-26: 8000 [IU] via INTRAVENOUS

## 2022-02-26 MED ORDER — EPHEDRINE SULFATE-NACL 50-0.9 MG/10ML-% IV SOSY
PREFILLED_SYRINGE | INTRAVENOUS | Status: DC | PRN
  Administered 2022-02-26: 5 mg via INTRAVENOUS
  Administered 2022-02-26: 10 mg via INTRAVENOUS

## 2022-02-26 MED ORDER — MIDAZOLAM HCL 2 MG/2ML IJ SOLN
INTRAMUSCULAR | Status: DC | PRN
Start: 2022-02-26 — End: 2022-02-26
  Administered 2022-02-26: 1 mg via INTRAVENOUS

## 2022-02-26 MED ORDER — LACTATED RINGERS IV SOLN: INTRAVENOUS | Status: DC

## 2022-02-26 MED ORDER — SODIUM CHLORIDE 0.9 % IV SOLN: 500.0000 mL | Freq: Once | INTRAVENOUS | Status: DC | PRN

## 2022-02-26 MED ORDER — METOPROLOL TARTRATE 5 MG/5ML IV SOLN: 2.0000 mg | INTRAVENOUS | Status: DC | PRN

## 2022-02-26 MED ORDER — ONDANSETRON HCL 4 MG/2ML IJ SOLN: 4.0000 mg | Freq: Four times a day (QID) | INTRAMUSCULAR | Status: DC | PRN

## 2022-02-26 MED ORDER — HYDRALAZINE HCL 20 MG/ML IJ SOLN: 5.0000 mg | INTRAMUSCULAR | Status: DC | PRN

## 2022-02-26 MED ORDER — PROTAMINE SULFATE 10 MG/ML IV SOLN
INTRAVENOUS | Status: DC | PRN
  Administered 2022-02-26: 25 mg via INTRAVENOUS

## 2022-02-26 MED ORDER — PROPOFOL 10 MG/ML IV BOLUS
INTRAVENOUS | Status: DC | PRN
  Administered 2022-02-26: 70 mg via INTRAVENOUS
  Administered 2022-02-26: 100 mg via INTRAVENOUS

## 2022-02-26 MED ORDER — LACTATED RINGERS IV SOLN: INTRAVENOUS | Status: DC | PRN

## 2022-02-26 MED ORDER — CEFAZOLIN SODIUM-DEXTROSE 2-4 GM/100ML-% IV SOLN
INTRAVENOUS | Status: AC
  Filled 2022-02-26: qty 100

## 2022-02-26 MED ORDER — HEPARIN 6000 UNIT IRRIGATION SOLUTION
Status: DC | PRN
  Administered 2022-02-26: 1

## 2022-02-26 MED ORDER — NEOSTIGMINE METHYLSULFATE 10 MG/10ML IV SOLN
INTRAVENOUS | Status: DC | PRN
Start: 2022-02-26 — End: 2022-02-26
  Administered 2022-02-26: 3 mg via INTRAVENOUS

## 2022-02-26 MED ORDER — OXYCODONE HCL 5 MG PO TABS: 5.0000 mg | ORAL_TABLET | Freq: Once | ORAL | Status: DC | PRN

## 2022-02-26 MED ORDER — OXYCODONE-ACETAMINOPHEN 5-325 MG PO TABS: 1.0000 | ORAL_TABLET | ORAL | Status: DC | PRN

## 2022-02-26 MED ORDER — POTASSIUM CHLORIDE CRYS ER 20 MEQ PO TBCR: 20.0000 meq | EXTENDED_RELEASE_TABLET | Freq: Every day | ORAL | Status: DC | PRN

## 2022-02-26 MED ORDER — TEMAZEPAM 15 MG PO CAPS
15.0000 mg | ORAL_CAPSULE | Freq: Every day | ORAL | Status: DC
  Administered 2022-02-26: 15 mg via ORAL
  Filled 2022-02-26: qty 1

## 2022-02-26 MED ORDER — IODIXANOL 320 MG/ML IV SOLN
INTRAVENOUS | Status: DC | PRN
  Administered 2022-02-26: 72 mL via INTRA_ARTERIAL

## 2022-02-26 MED ORDER — SUCCINYLCHOLINE CHLORIDE 200 MG/10ML IV SOSY
PREFILLED_SYRINGE | INTRAVENOUS | Status: DC | PRN
  Administered 2022-02-26: 140 mg via INTRAVENOUS

## 2022-02-26 MED ORDER — HEPARIN SODIUM (PORCINE) 1000 UNIT/ML IJ SOLN
INTRAMUSCULAR | Status: DC | PRN
Start: 2022-02-26 — End: 2022-02-26
  Administered 2022-02-26: 5000 [IU] via INTRAVENOUS

## 2022-02-26 MED ORDER — CEFAZOLIN SODIUM-DEXTROSE 2-4 GM/100ML-% IV SOLN
2.0000 g | Freq: Three times a day (TID) | INTRAVENOUS | Status: AC
  Administered 2022-02-26 – 2022-02-27 (×2): 2 g via INTRAVENOUS
  Filled 2022-02-26 (×2): qty 100

## 2022-02-26 MED ORDER — FENOFIBRATE 54 MG PO TABS
54.0000 mg | ORAL_TABLET | Freq: Every day | ORAL | Status: DC
Start: 2022-02-26 — End: 2022-02-27
  Administered 2022-02-27: 54 mg via ORAL
  Filled 2022-02-26 (×2): qty 1

## 2022-02-26 MED ORDER — LIDOCAINE 2% (20 MG/ML) 5 ML SYRINGE
INTRAMUSCULAR | Status: DC | PRN
Start: 2022-02-26 — End: 2022-02-26
  Administered 2022-02-26: 40 mg via INTRAVENOUS

## 2022-02-26 MED ORDER — CHLORHEXIDINE GLUCONATE CLOTH 2 % EX PADS: 6.0000 | MEDICATED_PAD | Freq: Once | CUTANEOUS | Status: DC

## 2022-02-26 MED ORDER — FENTANYL CITRATE (PF) 100 MCG/2ML IJ SOLN
INTRAMUSCULAR | Status: AC
  Filled 2022-02-26: qty 2

## 2022-02-26 MED ORDER — DOCUSATE SODIUM 100 MG PO CAPS
100.0000 mg | ORAL_CAPSULE | Freq: Every day | ORAL | Status: DC
  Administered 2022-02-27: 100 mg via ORAL
  Filled 2022-02-26: qty 1

## 2022-02-26 MED ORDER — ACETAMINOPHEN 650 MG RE SUPP: 325.0000 mg | RECTAL | Status: DC | PRN

## 2022-02-26 MED ORDER — FENTANYL CITRATE (PF) 100 MCG/2ML IJ SOLN
25.0000 ug | INTRAMUSCULAR | Status: DC | PRN
  Administered 2022-02-26: 25 ug via INTRAVENOUS

## 2022-02-26 MED ORDER — ALBUMIN HUMAN 5 % IV SOLN: INTRAVENOUS | Status: DC | PRN

## 2022-02-26 MED ORDER — PROPOFOL 10 MG/ML IV BOLUS
INTRAVENOUS | Status: AC
  Filled 2022-02-26: qty 20

## 2022-02-26 MED ORDER — GUAIFENESIN-DM 100-10 MG/5ML PO SYRP: 15.0000 mL | ORAL_SOLUTION | ORAL | Status: DC | PRN

## 2022-02-26 MED ORDER — PROTAMINE SULFATE 10 MG/ML IV SOLN
INTRAVENOUS | Status: DC | PRN
  Administered 2022-02-26: 50 mg via INTRAVENOUS

## 2022-02-26 MED ORDER — DEXMEDETOMIDINE (PRECEDEX) IN NS 20 MCG/5ML (4 MCG/ML) IV SYRINGE
PREFILLED_SYRINGE | INTRAVENOUS | Status: DC | PRN
  Administered 2022-02-26: 12 ug via INTRAVENOUS
  Administered 2022-02-26: 8 ug via INTRAVENOUS

## 2022-02-26 MED ORDER — HEMOSTATIC AGENTS (NO CHARGE) OPTIME
TOPICAL | Status: DC | PRN
  Administered 2022-02-26: 1 via TOPICAL

## 2022-02-26 MED ORDER — FENTANYL CITRATE (PF) 250 MCG/5ML IJ SOLN
INTRAMUSCULAR | Status: DC | PRN
  Administered 2022-02-26: 25 ug via INTRAVENOUS
  Administered 2022-02-26: 75 ug via INTRAVENOUS
  Administered 2022-02-26: 50 ug via INTRAVENOUS

## 2022-02-26 MED ORDER — PHENOL 1.4 % MT LIQD: 1.0000 | OROMUCOSAL | Status: DC | PRN

## 2022-02-26 MED ORDER — POLYETHYLENE GLYCOL 3350 17 G PO PACK: 17.0000 g | PACK | Freq: Every day | ORAL | Status: DC | PRN

## 2022-02-26 MED ORDER — PHENYLEPHRINE HCL (PRESSORS) 10 MG/ML IV SOLN
INTRAVENOUS | Status: DC | PRN
  Administered 2022-02-26: 80 ug via INTRAVENOUS

## 2022-02-26 MED ORDER — ONDANSETRON HCL 4 MG/2ML IJ SOLN
INTRAMUSCULAR | Status: DC | PRN
  Administered 2022-02-26: 4 mg via INTRAVENOUS

## 2022-02-26 MED ORDER — PHENYLEPHRINE HCL-NACL 20-0.9 MG/250ML-% IV SOLN
INTRAVENOUS | Status: DC | PRN
  Administered 2022-02-26: 25 ug/min via INTRAVENOUS

## 2022-02-26 MED ORDER — LABETALOL HCL 5 MG/ML IV SOLN: 10.0000 mg | INTRAVENOUS | Status: DC | PRN

## 2022-02-26 MED ORDER — SUGAMMADEX SODIUM 200 MG/2ML IV SOLN
INTRAVENOUS | Status: DC | PRN
  Administered 2022-02-26: 350 mg via INTRAVENOUS

## 2022-02-26 MED ORDER — OXYCODONE HCL 5 MG/5ML PO SOLN: 5.0000 mg | Freq: Once | ORAL | Status: DC | PRN

## 2022-02-26 MED ORDER — DUTASTERIDE 0.5 MG PO CAPS
0.5000 mg | ORAL_CAPSULE | Freq: Every day | ORAL | Status: DC
  Administered 2022-02-27: 0.5 mg via ORAL
  Filled 2022-02-26 (×2): qty 1

## 2022-02-26 MED ORDER — ROCURONIUM BROMIDE 10 MG/ML (PF) SYRINGE
PREFILLED_SYRINGE | INTRAVENOUS | Status: DC | PRN
  Administered 2022-02-26 (×2): 50 mg via INTRAVENOUS

## 2022-02-26 MED ORDER — ESMOLOL HCL 100 MG/10ML IV SOLN
INTRAVENOUS | Status: DC | PRN
  Administered 2022-02-26: 30 mg via INTRAVENOUS

## 2022-02-26 MED ORDER — CHLORHEXIDINE GLUCONATE 0.12 % MT SOLN: 15.0000 mL | Freq: Once | OROMUCOSAL | Status: AC

## 2022-02-26 MED ORDER — FLUTICASONE PROPIONATE 50 MCG/ACT NA SUSP
1.0000 | Freq: Every day | NASAL | Status: DC
Start: 2022-02-27 — End: 2022-02-27
  Administered 2022-02-27: 1 via NASAL
  Filled 2022-02-26: qty 16

## 2022-02-26 MED ORDER — FENTANYL CITRATE (PF) 250 MCG/5ML IJ SOLN
INTRAMUSCULAR | Status: AC
  Filled 2022-02-26: qty 5

## 2022-02-26 MED ORDER — ACETAMINOPHEN 500 MG PO TABS: 1000.0000 mg | ORAL_TABLET | Freq: Once | ORAL | Status: AC

## 2022-02-26 MED ORDER — HEPARIN 6000 UNIT IRRIGATION SOLUTION
Status: DC | PRN
Start: 2022-02-26 — End: 2022-02-26
  Administered 2022-02-26: 1

## 2022-02-26 MED ORDER — ALUM & MAG HYDROXIDE-SIMETH 200-200-20 MG/5ML PO SUSP: 15.0000 mL | ORAL | Status: DC | PRN

## 2022-02-26 MED ORDER — PAPAVERINE HCL 30 MG/ML IJ SOLN
INTRAMUSCULAR | Status: AC
  Filled 2022-02-26: qty 2

## 2022-02-26 MED ORDER — CHLORHEXIDINE GLUCONATE CLOTH 2 % EX PADS
6.0000 | MEDICATED_PAD | Freq: Once | CUTANEOUS | Status: DC
  Administered 2022-02-26: 6 via TOPICAL

## 2022-02-26 MED ORDER — ORAL CARE MOUTH RINSE: 15.0000 mL | Freq: Once | OROMUCOSAL | Status: AC

## 2022-02-26 MED ORDER — MORPHINE SULFATE (PF) 2 MG/ML IV SOLN: 2.0000 mg | INTRAVENOUS | Status: DC | PRN

## 2022-02-26 MED ORDER — TAMSULOSIN HCL 0.4 MG PO CAPS
0.8000 mg | ORAL_CAPSULE | Freq: Every day | ORAL | Status: DC
  Administered 2022-02-26: 0.8 mg via ORAL
  Filled 2022-02-26: qty 2

## 2022-02-26 MED ORDER — ONDANSETRON HCL 4 MG/2ML IJ SOLN: 4.0000 mg | Freq: Once | INTRAMUSCULAR | Status: DC | PRN

## 2022-02-26 MED ORDER — 0.9 % SODIUM CHLORIDE (POUR BTL) OPTIME
TOPICAL | Status: DC | PRN
  Administered 2022-02-26 (×3): 1000 mL

## 2022-02-26 MED ORDER — CHLORHEXIDINE GLUCONATE 0.12 % MT SOLN
OROMUCOSAL | Status: AC
  Administered 2022-02-26: 15 mL via OROMUCOSAL
  Filled 2022-02-26: qty 15

## 2022-02-26 MED ORDER — PROPOFOL 10 MG/ML IV BOLUS
INTRAVENOUS | Status: DC | PRN
  Administered 2022-02-26: 130 mg via INTRAVENOUS

## 2022-02-26 MED ORDER — TETRAHYDROZOLINE HCL 0.05 % OP SOLN
1.0000 [drp] | Freq: Every day | OPHTHALMIC | Status: DC | PRN
  Filled 2022-02-26: qty 15

## 2022-02-26 SURGICAL SUPPLY — 63 items
BAG COUNTER SPONGE SURGICOUNT (BAG) ×3 IMPLANT
BALLN MUSTANG 12.0X40 135 (BALLOONS) ×3
BALLOON MUSTANG 12.0X40 135 (BALLOONS) IMPLANT
BLADE CLIPPER SURG (BLADE) ×3 IMPLANT
CANISTER SUCT 3000ML PPV (MISCELLANEOUS) ×3 IMPLANT
CATH BEACON 5.038 65CM KMP-01 (CATHETERS) ×3 IMPLANT
CATH OMNI FLUSH .035X70CM (CATHETERS) ×3 IMPLANT
CLIP LIGATING EXTRA MED SLVR (CLIP) ×3 IMPLANT
CLIP LIGATING EXTRA SM BLUE (MISCELLANEOUS) ×3 IMPLANT
DERMABOND ADHESIVE PROPEN (GAUZE/BANDAGES/DRESSINGS) ×2
DERMABOND ADVANCED (GAUZE/BANDAGES/DRESSINGS) ×1
DERMABOND ADVANCED .7 DNX12 (GAUZE/BANDAGES/DRESSINGS) ×2 IMPLANT
DERMABOND ADVANCED .7 DNX6 (GAUZE/BANDAGES/DRESSINGS) IMPLANT
DEVICE CLOSURE PERCLS PRGLD 6F (VASCULAR PRODUCTS) IMPLANT
DEVICE TORQUE KENDALL .025-038 (MISCELLANEOUS) IMPLANT
DRSG TEGADERM 2-3/8X2-3/4 SM (GAUZE/BANDAGES/DRESSINGS) ×6 IMPLANT
DRYSEAL FLEXSHEATH 12FR 33CM (SHEATH) ×1
DRYSEAL FLEXSHEATH 18FR 33CM (SHEATH) ×1
ELECT REM PT RETURN 9FT ADLT (ELECTROSURGICAL) ×6
ELECTRODE REM PT RTRN 9FT ADLT (ELECTROSURGICAL) ×4 IMPLANT
EXCLDR TRNK ENDO 32X14.5X14 18 (Endovascular Graft) ×3 IMPLANT
EXCLUDER TNK END 32X14.5X14 18 (Endovascular Graft) IMPLANT
GAUZE SPONGE 2X2 8PLY STRL LF (GAUZE/BANDAGES/DRESSINGS) ×4 IMPLANT
GLIDEWIRE ADV .035X180CM (WIRE) ×1 IMPLANT
GLOVE SURG ENC MOIS LTX SZ7.5 (GLOVE) ×3 IMPLANT
GOWN STRL REUS W/ TWL LRG LVL3 (GOWN DISPOSABLE) ×4 IMPLANT
GOWN STRL REUS W/ TWL XL LVL3 (GOWN DISPOSABLE) ×4 IMPLANT
GOWN STRL REUS W/TWL LRG LVL3 (GOWN DISPOSABLE) ×6
GOWN STRL REUS W/TWL XL LVL3 (GOWN DISPOSABLE) ×6
GRAFT BALLN CATH 65CM (STENTS) ×2 IMPLANT
GUIDEWIRE ANGLED .035X150CM (WIRE) IMPLANT
KIT BASIN OR (CUSTOM PROCEDURE TRAY) ×3 IMPLANT
KIT DRAIN CSF ACCUDRAIN (MISCELLANEOUS) IMPLANT
KIT ENCORE 26 ADVANTAGE (KITS) ×1 IMPLANT
KIT TURNOVER KIT B (KITS) ×3 IMPLANT
LEG CONTRALATERAL 16X14.5X12 (Vascular Products) ×2 IMPLANT
NDL PERC 18GX7CM (NEEDLE) IMPLANT
NEEDLE PERC 18GX7CM (NEEDLE) IMPLANT
NS IRRIG 1000ML POUR BTL (IV SOLUTION) ×3 IMPLANT
PACK ENDOVASCULAR (PACKS) ×3 IMPLANT
PAD ARMBOARD 7.5X6 YLW CONV (MISCELLANEOUS) ×6 IMPLANT
PERCLOSE PROGLIDE 6F (VASCULAR PRODUCTS) ×18
SET MICROPUNCTURE 5F STIFF (MISCELLANEOUS) ×3 IMPLANT
SHEATH BRITE TIP 8FR 23CM (SHEATH) ×3 IMPLANT
SHEATH DRYSEAL FLEX 12FR 33CM (SHEATH) IMPLANT
SHEATH DRYSEAL FLEX 18FR 33CM (SHEATH) IMPLANT
SHEATH PINNACLE 8F 10CM (SHEATH) ×3 IMPLANT
SPONGE GAUZE 2X2 STER 10/PKG (GAUZE/BANDAGES/DRESSINGS) ×2
STENT GRAFT BALLN CATH 65CM (STENTS) ×3
STOPCOCK MORSE 400PSI 3WAY (MISCELLANEOUS) ×3 IMPLANT
SUT MNCRL AB 4-0 PS2 18 (SUTURE) ×6 IMPLANT
SUT PROLENE 5 0 C 1 24 (SUTURE) IMPLANT
SUT VIC AB 2-0 CT1 27 (SUTURE)
SUT VIC AB 2-0 CT1 TAPERPNT 27 (SUTURE) IMPLANT
SUT VIC AB 3-0 SH 27 (SUTURE)
SUT VIC AB 3-0 SH 27X BRD (SUTURE) IMPLANT
SYR 20ML LL LF (SYRINGE) ×3 IMPLANT
TOWEL GREEN STERILE (TOWEL DISPOSABLE) ×3 IMPLANT
TRAY FOLEY MTR SLVR 14FR STAT (SET/KITS/TRAYS/PACK) ×1 IMPLANT
TRAY FOLEY MTR SLVR 16FR STAT (SET/KITS/TRAYS/PACK) ×2 IMPLANT
TUBING INJECTOR 48 (MISCELLANEOUS) ×3 IMPLANT
WIRE AMPLATZ SS-J .035X180CM (WIRE) ×5 IMPLANT
WIRE BENTSON .035X145CM (WIRE) ×6 IMPLANT

## 2022-02-26 SURGICAL SUPPLY — 62 items
BAG COUNTER SPONGE SURGICOUNT (BAG) ×2 IMPLANT
BANDAGE ESMARK 6X9 LF (GAUZE/BANDAGES/DRESSINGS) IMPLANT
BNDG ELASTIC 4X5.8 VLCR STR LF (GAUZE/BANDAGES/DRESSINGS) IMPLANT
BNDG ESMARK 6X9 LF (GAUZE/BANDAGES/DRESSINGS)
CANISTER SUCT 3000ML PPV (MISCELLANEOUS) ×2 IMPLANT
CANNULA VESSEL 3MM 2 BLNT TIP (CANNULA) IMPLANT
CATH EMB 3FR 80CM (CATHETERS) IMPLANT
CATH EMB 4FR 80CM (CATHETERS) IMPLANT
CATH EMB 5FR 80CM (CATHETERS) IMPLANT
CLIP LIGATING EXTRA MED SLVR (CLIP) ×2 IMPLANT
CLIP LIGATING EXTRA SM BLUE (MISCELLANEOUS) ×2 IMPLANT
CUFF TOURN SGL QUICK 24 (TOURNIQUET CUFF)
CUFF TOURN SGL QUICK 34 (TOURNIQUET CUFF)
CUFF TOURN SGL QUICK 42 (TOURNIQUET CUFF) IMPLANT
CUFF TRNQT CYL 24X4X16.5-23 (TOURNIQUET CUFF) IMPLANT
CUFF TRNQT CYL 34X4.125X (TOURNIQUET CUFF) IMPLANT
DERMABOND ADVANCED (GAUZE/BANDAGES/DRESSINGS) ×1
DERMABOND ADVANCED .7 DNX12 (GAUZE/BANDAGES/DRESSINGS) ×1 IMPLANT
DRAIN CHANNEL 15F RND FF W/TCR (WOUND CARE) IMPLANT
DRAPE HALF SHEET 40X57 (DRAPES) IMPLANT
DRAPE X-RAY CASS 24X20 (DRAPES) IMPLANT
ELECT REM PT RETURN 9FT ADLT (ELECTROSURGICAL) ×2
ELECTRODE REM PT RTRN 9FT ADLT (ELECTROSURGICAL) ×1 IMPLANT
EVACUATOR SILICONE 100CC (DRAIN) IMPLANT
GLOVE SURG ENC MOIS LTX SZ7.5 (GLOVE) ×2 IMPLANT
GLOVE SURG MICRO LTX SZ6.5 (GLOVE) ×1 IMPLANT
GOWN STRL REUS W/ TWL LRG LVL3 (GOWN DISPOSABLE) ×2 IMPLANT
GOWN STRL REUS W/ TWL XL LVL3 (GOWN DISPOSABLE) ×1 IMPLANT
GOWN STRL REUS W/TWL LRG LVL3 (GOWN DISPOSABLE) ×4
GOWN STRL REUS W/TWL XL LVL3 (GOWN DISPOSABLE) ×2
INSERT FOGARTY SM (MISCELLANEOUS) IMPLANT
KIT BASIN OR (CUSTOM PROCEDURE TRAY) ×2 IMPLANT
KIT TURNOVER KIT B (KITS) ×2 IMPLANT
MARKER GRAFT CORONARY BYPASS (MISCELLANEOUS) IMPLANT
NS IRRIG 1000ML POUR BTL (IV SOLUTION) ×5 IMPLANT
PACK PERIPHERAL VASCULAR (CUSTOM PROCEDURE TRAY) ×2 IMPLANT
PAD ARMBOARD 7.5X6 YLW CONV (MISCELLANEOUS) ×4 IMPLANT
PATCH VASC XENOSURE 1CMX6CM (Vascular Products) ×2 IMPLANT
PATCH VASC XENOSURE 1X6 (Vascular Products) IMPLANT
POWDER SURGICEL 3.0 GRAM (HEMOSTASIS) ×1 IMPLANT
SET COLLECT BLD 21X3/4 12 (NEEDLE) IMPLANT
STOPCOCK 4 WAY LG BORE MALE ST (IV SETS) IMPLANT
SUT ETHILON 3 0 PS 1 (SUTURE) IMPLANT
SUT GORETEX 6.0 TT13 (SUTURE) IMPLANT
SUT GORETEX 6.0 TT9 (SUTURE) IMPLANT
SUT MNCRL AB 4-0 PS2 18 (SUTURE) ×3 IMPLANT
SUT PROLENE 5 0 C 1 24 (SUTURE) ×3 IMPLANT
SUT PROLENE 6 0 BV (SUTURE) ×4 IMPLANT
SUT PROLENE 7 0 BV 1 (SUTURE) IMPLANT
SUT SILK 2 0 SH (SUTURE) ×1 IMPLANT
SUT SILK 3 0 (SUTURE)
SUT SILK 3-0 18XBRD TIE 12 (SUTURE) IMPLANT
SUT VIC AB 2-0 CT1 27 (SUTURE) ×2
SUT VIC AB 2-0 CT1 TAPERPNT 27 (SUTURE) ×2 IMPLANT
SUT VIC AB 3-0 SH 27 (SUTURE) ×2
SUT VIC AB 3-0 SH 27X BRD (SUTURE) ×2 IMPLANT
TAPE UMBILICAL COTTON 1/8X30 (MISCELLANEOUS) IMPLANT
TOWEL GREEN STERILE (TOWEL DISPOSABLE) ×2 IMPLANT
TRAY FOLEY MTR SLVR 16FR STAT (SET/KITS/TRAYS/PACK) IMPLANT
TUBING EXTENTION W/L.L. (IV SETS) IMPLANT
UNDERPAD 30X36 HEAVY ABSORB (UNDERPADS AND DIAPERS) ×2 IMPLANT
WATER STERILE IRR 1000ML POUR (IV SOLUTION) ×2 IMPLANT

## 2022-02-26 NOTE — H&P (Signed)
?  ? ?  ?HPI: ?  ?Charles Hartman is a 76 y.o. male history of abdominal aortic aneurysm first discovered during evaluation for diverticulitis.  He did not previously know of any personal or family history of aneurysm.  He has no new back or abdominal pain and has recovered well from his diverticulitis.  He is not having any issues related to today.  CT scan was performed prior to today's visit. ?  ?    ?Past Medical History:  ?Diagnosis Date  ? AAA (abdominal aortic aneurysm)    ? Arthritis    ? Enlarged prostate    ? GERD (gastroesophageal reflux disease)    ?  ?History reviewed. No pertinent family history. ?     ?Past Surgical History:  ?Procedure Laterality Date  ? EUS N/A 01/22/2015  ?  Procedure: ESOPHAGEAL ENDOSCOPIC ULTRASOUND (EUS) RADIAL;  Surgeon: Willis Modena, MD;  Location: [facility] ENDOSCOPY;  Service: Endoscopy;  Laterality: N/A;  ? FINE NEEDLE ASPIRATION N/A 01/22/2015  ?  Procedure: FINE NEEDLE ASPIRATION (FNA) LINEAR;  Surgeon: Willis Modena, MD;  Location: [facility] ENDOSCOPY;  Service: Endoscopy;  Laterality: N/A;  ? HERNIA REPAIR      ?  bilateral inguinal hernia  ? TRANSURETHRAL RESECTION OF PROSTATE      ?  ?  ?Short Social History:  ?Social History  ?  ?     ?Tobacco Use  ? Smoking status: Former  ?    Years: 50.00  ?    Types: Cigarettes  ?    Quit date: 10/07/2013  ?    Years since quitting: 8.1  ? Smokeless tobacco: Never  ?Substance Use Topics  ? Alcohol use: No  ?  ?  ?    ?Allergies  ?Allergen Reactions  ? Novocain [Procaine] Anaphylaxis  ?  ?  ?      ?Current Outpatient Medications  ?Medication Sig Dispense Refill  ? aspirin EC 81 MG tablet Take 162 mg by mouth every evening.      ? dutasteride (AVODART) 0.5 MG capsule Take 0.5 mg by mouth daily.      ? fenofibrate micronized (LOFIBRA) 134 MG capsule Take 134 mg by mouth every evening.      ? lovastatin (MEVACOR) 40 MG tablet Take 40 mg by mouth at bedtime.      ? RABEprazole (ACIPHEX) 20 MG tablet Take 20 mg by mouth daily.      ? tamsulosin  (FLOMAX) 0.4 MG CAPS capsule Take 0.4 mg by mouth at bedtime.      ? temazepam (RESTORIL) 15 MG capsule Take 15 mg by mouth at bedtime.      ?  ?No current facility-administered medications for this visit.  ?  ?  ?Review of Systems  ?Constitutional:  Constitutional negative. ?HENT: HENT negative.  ?Eyes: Eyes negative.  ?Respiratory: Respiratory negative.  ?Cardiovascular: Cardiovascular negative.  ?GI: Gastrointestinal negative.  ?Musculoskeletal: Musculoskeletal negative.  ?Skin: Skin negative.  ?Neurological: Neurological negative. ?Hematologic: Hematologic/lymphatic negative.  ?Psychiatric: Psychiatric negative.   ?  ?  ?  ?Objective:  ?  ?Vitals:  ? 02/26/22 0555  ?BP: (!) 169/93  ?Pulse: 60  ?Resp: 18  ?Temp: 97.7 ?F (36.5 ?C)  ?SpO2: 97%  ? ? ?  ?Physical Exam ?HENT:  ?   Head: Normocephalic.  ?   Nose:  ?   Comments: Wearing a mask ?Eyes:  ?   Pupils: Pupils are equal, round, and reactive to light.  ?Cardiovascular:  ?   Rate  and Rhythm: Normal rate.  ?Pulmonary:  ?   Effort: Pulmonary effort is normal.  ?Abdominal:  ?   General: Abdomen is flat.  ?   Palpations: Abdomen is soft. There is no mass.  ?Musculoskeletal:     ?   General: Normal range of motion.  ?   Cervical back: Normal range of motion and neck supple.  ?Skin: ?   General: Skin is warm and dry.  ?   Capillary Refill: Capillary refill takes less than 2 seconds.  ?Neurological:  ?   General: No focal deficit present.  ?   Mental Status: He is alert.  ?Psychiatric:     ?   Mood and Affect: Mood normal.  ?  ?  ?Data: ?VASCULAR ?  ?1. interval enlargement of now 5.4 cm fusiform AAA, previously 5.2 ?cm. The patient is followed by a vascular specialist (Dr. Randie Heinz). ?This recommendation follows ACR consensus guidelines: White Paper of ?the ACR Incidental Findings Committee II on Vascular Findings. J Am ?Coll Radiol 2013; 10:789-794. ?2. 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 ?  ?NON-VASCULAR ?  ?1. RIGHT basilar opacity may represent  atelectasis, though early ?aspiration pneumonia could appear similar. ?2. Marked prostatomegaly, with mass effect into the urinary bladder. ?Consider urology referral, if not previously performed. ?3. Severe sigmoid diverticulosis. Additional incidental and ?senescent findings, as above. ?  ?  ?   ?Assessment/Plan:  ?  ?76 year old with what appears to be 5.5 cm fusiform infrarenal abdominal aortic aneurysm measured 5.4 cm by radiology.  I reviewed the CT scan with the patient and his daughter during our visit today and discussed his options being continued watchful waiting, open aneurysm repair versus endovascular aneurysm repair.  I have recommended endovascular repair to decrease the short-term risks with the possibility of further intervention in the near future.  I discussed the risk benefits as well as the above-noted alternatives and the patient and his daughter agreed to proceed.  We will get cardiac clearance and plan to proceed after this.  He can continue aspirin.  All questions were answered. ?  ?  ?  ?  ?  ?Maeola Harman MD ?[facility] ?

## 2022-02-26 NOTE — Anesthesia Procedure Notes (Signed)
Arterial Line Insertion ?Start/End3/02/2022 7:15 AM ?Performed by: Laruth Bouchard., CRNA, CRNA ? Patient location: Pre-op. ?Preanesthetic checklist: patient identified, IV checked, site marked, risks and benefits discussed, surgical consent, monitors and equipment checked, pre-op evaluation, timeout performed and anesthesia consent ?Lidocaine 1% used for infiltration ?Right, radial was placed ?Catheter size: 20 G ?Hand hygiene performed  and maximum sterile barriers used  ? ?Attempts: 1 ?Procedure performed without using ultrasound guided technique. ?Following insertion, dressing applied and Biopatch. ?Post procedure assessment: normal ? ?Patient tolerated the procedure well with no immediate complications. ? ? ?

## 2022-02-26 NOTE — Transfer of Care (Signed)
Immediate Anesthesia Transfer of Care Note  Patient: NYREE YONKER  Procedure(s) Performed: THROMBECTOMY RIGHT LOWER EXTREMETY (Right: Groin) PATCH ANGIOPLASTY WITH BOVINE PATCH 1 cm x 6 cm (Right: Groin) ENDARTERECTOMY FEMORAL (Right: Groin)  Patient Location: PACU  Anesthesia Type:General  Level of Consciousness: awake and alert   Airway & Oxygen Therapy: Patient Spontanous Breathing  Post-op Assessment: Report given to RN and Post -op Vital signs reviewed and stable  Post vital signs: Reviewed and stable  Last Vitals:  Vitals Value Taken Time  BP 134/55 02/26/22 1150  Temp 36.2 C 02/26/22 1150  Pulse 55 02/26/22 1152  Resp 21 02/26/22 1152  SpO2 100 % 02/26/22 1152  Vitals shown include unvalidated device data.  Last Pain:  Vitals:   02/26/22 0604  TempSrc:   PainSc: 0-No pain         Complications: No notable events documented.

## 2022-02-26 NOTE — Op Note (Signed)
? ? ?  Patient name: Charles Hartman MRN: [id number] DOB: [birth year] Sex: male ? ?02/26/2022 ?Pre-operative Diagnosis: Ischemic right lower extremity status post endovascular aneurysm repair ?Post-operative diagnosis:  Same ?Surgeon:  Luanna Salk. Randie Heinz, MD ?Assistant: Doreatha Massed, PA ?Procedure Performed:  Right common femoral endarterectomy with bovine pericardial patch angioplasty ? ?Indications: 76 year old male underwent endovascular aneurysm repair earlier today.  In the PACU he was noted to have loss of signals and pain in the right leg although the foot did not look grossly ischemic we elected to return to the operating room for right common femoral endarterectomy and possible thrombectomy. ? ?An experienced assistant was necessary to facilitate the exposure including suction and countertraction as well as assist with endarterectomy using countertraction and with suture anastomosis of the pericardial patch. ? ?Findings: There was no thrombus.  There was significant common femoral plaque.  We performed endarterectomy of the entire common femoral artery we establish very strong inflow and good backbleeding from large profunda artery as well as a large medial circumflex artery in the SFA which was severely diseased.  Completion there was a palpable dorsalis pedis pulse. ?  ?Procedure:  The patient was identified in the holding area and taken to the operating room where is placed supine on the operative table and general anesthesia was induced.  He was sterilely prepped and draped in the right lower extremity usual fashion, antibiotics were administered and a timeout was called.  We began with vertical incision where we previously had access to the groin.  We dissected down through significant hematoma.  The assistant was necessary to help with countertraction and suction.  We identified the common femoral artery.  We dissected up onto the inguinal ligament.  We placed a vessel loop around the external leg artery as  well as side branches.  We then dissected free the SFA the profunda and the medial circumflex branch.  Patient was given 5000 units of heparin.  We clamped the inflow followed by the outflow.  The artery was opened longitudinally.  We performed extensive endarterectomy including up onto the inguinal ligament and the assistant helped with exposure here.  We performed endarterectomy down to the SFA and the profunda branch.  There was 1 area where we tacked the plaque up.  We then trimmed a bovine pericardial patch to size and sewed this in place with 5-0 Prolene suture.  The assistant did help with maintaining traction on the suture line.  Prior completion without flushing all directions.  Upon completion there was good signal distally in the SFA and the profunda and a palpable dorsalis pedis pulse.  We administered 25 mg of protamine.  We meticulously obtained hemostasis and irrigated the wound.  The assistant then assisted with closure of the wound with Vicryl and Monocryl suture.  Dermabond placed at the skin level.  Patient was awakened from anesthesia having tolerated procedure without immediate complication.  All counts were correct at completion. ? ?Contrast: 100cc ? ?Andruw Battie C. Randie Heinz, MD ?[facility] ?Office: [phone] ?Pager: [phone] ? ? ?

## 2022-02-26 NOTE — Anesthesia Postprocedure Evaluation (Signed)
Anesthesia Post Note ? ?Patient: Charles Hartman ? ?Procedure(s) Performed: THROMBECTOMY RIGHT LOWER EXTREMETY (Right: Groin) ?PATCH ANGIOPLASTY WITH BOVINE PATCH 1 cm x 6 cm (Right: Groin) ?ENDARTERECTOMY FEMORAL (Right: Groin) ? ?  ? ?Patient location during evaluation: PACU ?Anesthesia Type: General ?Level of consciousness: awake and alert ?Pain management: pain level controlled ?Vital Signs Assessment: post-procedure vital signs reviewed and stable ?Respiratory status: spontaneous breathing, nonlabored ventilation, respiratory function stable and patient connected to nasal cannula oxygen ?Cardiovascular status: blood pressure returned to baseline and stable ?Postop Assessment: no apparent nausea or vomiting ?Anesthetic complications: no ? ? ?No notable events documented. ? ?Last Vitals:  ?Vitals:  ? 02/26/22 1250 02/26/22 1305  ?BP: 138/63 (!) 148/68  ?Pulse: (!) 49 (!) 57  ?Resp: 15 15  ?Temp:    ?SpO2: 98% 97%  ?  ?Last Pain:  ?Vitals:  ? 02/26/22 1320  ?TempSrc:   ?PainSc: 0-No pain  ? ? ?  ?  ?  ?  ?  ?  ? ?Audry Pili ? ? ? ? ?

## 2022-02-26 NOTE — Anesthesia Procedure Notes (Signed)
Procedure Name: Intubation ?Date/Time: 02/26/2022 10:15 AM ?Performed by: Georgia Duff, CRNA ?Pre-anesthesia Checklist: Patient identified, Emergency Drugs available, Suction available and Patient being monitored ?Patient Re-evaluated:Patient Re-evaluated prior to induction ?Oxygen Delivery Method: Circle system utilized ?Preoxygenation: Pre-oxygenation with 100% oxygen ?Induction Type: IV induction ?Ventilation: Mask ventilation without difficulty ?Laryngoscope Size: Mac and 4 ?Grade View: Grade I ?Tube type: Oral ?Tube size: 7.5 mm ?Number of attempts: 1 ?Airway Equipment and Method: Stylet and Oral airway ?Placement Confirmation: ETT inserted through vocal cords under direct vision, positive ETCO2 and breath sounds checked- equal and bilateral ?Secured at: 21 cm ?Tube secured with: Tape ?Dental Injury: Teeth and Oropharynx as per pre-operative assessment  ?Comments: SRNA DL x1 successful  ? ? ? ? ?

## 2022-02-26 NOTE — Anesthesia Preprocedure Evaluation (Signed)
Anesthesia Evaluation  ?Patient identified by MRN, date of birth, ID band ?Patient awake ? ? ? ?Reviewed: ?Allergy & Precautions, NPO status , Patient's Chart, lab work & pertinent test results ? ?History of Anesthesia Complications ?Negative for: history of anesthetic complications ? ?Airway ?Mallampati: II ? ?TM Distance: >3 FB ?Neck ROM: Full ? ? ? Dental ? ?(+) Edentulous Lower, Edentulous Upper ?  ?Pulmonary ?former smoker,  ?  ?Pulmonary exam normal ? ? ? ? ? ? ? Cardiovascular ?+ Peripheral Vascular Disease  ?Normal cardiovascular exam ? ? ?'22 TTE - EF 60 to 65%. Grade I diastolic dysfunction (impaired relaxation). Left atrial size was mildly dilated.  ? ?  ?Neuro/Psych ?negative neurological ROS ? negative psych ROS  ? GI/Hepatic ?Neg liver ROS, GERD  Medicated and Controlled,  ?Endo/Other  ? ?Obesity ?Pre-DM ? ? Renal/GU ?negative Renal ROS  ? ?  ?Musculoskeletal ? ?(+) Arthritis ,  ? Abdominal ?  ?Peds ? Hematology ?negative hematology ROS ?(+)   ?Anesthesia Other Findings ? ? Reproductive/Obstetrics ? ?  ? ? ? ? ? ? ? ? ? ? ? ? ? ?  ?  ? ? ? ? ? ? ? ? ?Anesthesia Physical ? ?Anesthesia Plan ? ?ASA: 3 ? ?Anesthesia Plan: General  ? ?Post-op Pain Management: Tylenol PO (pre-op)*  ? ?Induction: Intravenous ? ?PONV Risk Score and Plan: 2 and Treatment may vary due to age or medical condition ? ?Airway Management Planned: Oral ETT ? ?Additional Equipment: None ? ?Intra-op Plan:  ? ?Post-operative Plan: Extubation in OR ? ?Informed Consent: I have reviewed the patients History and Physical, chart, labs and discussed the procedure including the risks, benefits and alternatives for the proposed anesthesia with the patient or authorized representative who has indicated his/her understanding and acceptance.  ? ? ? ?Dental advisory given ? ?Plan Discussed with: CRNA and Anesthesiologist ? ?Anesthesia Plan Comments: (Bring back, no pulses in PACU after procedure. Already received  tylenol and antiemetics. Will redose zofran pending time in OR.)  ? ? ? ? ? ? ?Anesthesia Quick Evaluation ? ?

## 2022-02-26 NOTE — Anesthesia Procedure Notes (Signed)
Procedure Name: Intubation ?Date/Time: 02/26/2022 7:41 AM ?Performed by: Clearnce Sorrel, CRNA ?Pre-anesthesia Checklist: Patient identified, Emergency Drugs available, Suction available and Patient being monitored ?Patient Re-evaluated:Patient Re-evaluated prior to induction ?Oxygen Delivery Method: Circle System Utilized ?Preoxygenation: Pre-oxygenation with 100% oxygen ?Induction Type: IV induction ?Ventilation: Mask ventilation without difficulty ?Laryngoscope Size: Mac and 4 ?Grade View: Grade I ?Tube type: Oral ?Tube size: 7.5 mm ?Number of attempts: 1 ?Airway Equipment and Method: Stylet and Oral airway ?Placement Confirmation: ETT inserted through vocal cords under direct vision, positive ETCO2 and breath sounds checked- equal and bilateral ?Secured at: 23 cm ?Tube secured with: Tape ?Dental Injury: Teeth and Oropharynx as per pre-operative assessment  ? ? ? ? ?

## 2022-02-26 NOTE — Discharge Instructions (Addendum)
°[facility]  ° °Discharge Instructions ° °Endovascular Aortic Aneurysm Repair ° °Please refer to the following instructions for your post-procedure care. Your surgeon or Physician Assistant will discuss any changes with you. ° °Activity ° °You are encouraged to walk as much as you can. You can slowly return to normal activities but must avoid strenuous activity and heavy lifting until your doctor tells you it's OK. Avoid activities such as vacuuming or swinging a gold club. It is normal to feel tired for several weeks after your surgery. Do not drive until your doctor gives the OK and you are no longer taking prescription pain medications. It is also normal to have difficulty with sleep habits, eating, and bowel movements after surgery. These will go away with time. ° °Bathing/Showering ° °Shower daily after you go home.  Do not soak in a bathtub, hot tub, or swim until the incision heals completely. ° °If you have incisions in your groin, wash the groin wounds with soap and water daily and pat dry. (No tub bath-only shower)  Then put a dry gauze or washcloth there to keep this area dry to help prevent wound infection daily and as needed.  Do not use Vaseline or neosporin on your incisions.  Only use soap and water on your incisions and then protect and keep dry. ° °Incision Care ° °Shower every day. Clean your incision with mild soap and water. Pat the area dry with a clean towel. You do not need a bandage unless otherwise instructed. Do not apply any ointments or creams to your incision. If you clothing is irritating, you may cover your incision with a dry gauze pad. ° °Wash the groin wound with soap and water daily and pat dry. (No tub bath-only shower)  Then put a dry gauze or washcloth there to keep this area dry daily and as needed.  Do not use Vaseline or neosporin on your incisions.  Only use soap and water on your incisions and then protect and keep dry. ° ° °Diet ° °Resume  your normal diet. There are no special food restrictions following this procedure. A low fat/low cholesterol diet is recommended for all patients with vascular disease. In order to heal from your surgery, it is CRITICAL to get adequate nutrition. Your body requires vitamins, minerals, and protein. Vegetables are the best source of vitamins and minerals. Vegetables also provide the perfect balance of protein. Processed food has little nutritional value, so try to avoid this. ° °Medications ° °Resume taking all of your medications unless your doctor or nurse practitioner tells you not to. If your incision is causing pain, you may take over-the-counter pain relievers such as acetaminophen (Tylenol). If you were prescribed a stronger pain medication, please be aware these medications can cause nausea and constipation. Prevent nausea by taking the medication with a snack or meal. Avoid constipation by drinking plenty of fluids and eating foods with a high amount of fiber, such as fruits, vegetables, and grains.  °Do not take Tylenol if you are taking prescription pain medications. ° ° °Follow up ° °Our office will schedule a follow-up appointment with a CT scan 3-4 weeks after your surgery. ° °Please call us immediately for any of the following conditions ° °• Severe or worsening pain in your legs or feet or in your abdomen back or chest. °• Increased pain, redness, drainage (pus) from your incision site. °• Increased abdominal pain, bloating, nausea, vomiting or persistent diarrhea. °• Fever of 101   degrees or higher. °• Swelling in your leg (s), °•  °Reduce your risk of vascular disease ° °•Stop smoking. If you would like help call [facility] at 1-800-QUIT-NOW (1-800-784-8669) or  at 336-586-4000. °•Manage your cholesterol °•Maintain a desired weight °•Control your diabetes °•Keep your blood pressure down ° °If you have questions, please call the office at [phone]. ° °

## 2022-02-26 NOTE — Op Note (Signed)
? ? ?Patient name: Charles Hartman MRN: [id number] DOB: Oct 12, 1946 Sex: male ? ?02/26/2022 ?Pre-operative Diagnosis: Abdominal aortic aneurysm ?Post-operative diagnosis:  Same ?Surgeon:  Luanna Salk. Randie Heinz, MD ?Assistant: Doreatha Massed, PA ?Procedure Performed: ?1.  Bilateral common femoral artery percutaneous access and closure ?2.  Endovascular aortic aneurysm repair with main body right 32 x 14 x 14 extended with 14 x 12 cm limb and left-sided contralateral limb 14 x 12 cm with balloon angioplasty of left limb with 12 x 40 Mustang ? ?Indications: 76 year old male with history of abdominal aortic aneurysm.  We reviewed his images he is a candidate for endovascular aneurysm repair for which he presents today. ? ?An assistant was necessary to facilitate wire and catheter and sheath exchange. ? ?Findings: The left renal artery was the lowest.  Device was placed just below this.  At completion there were no endoleak's.  The left limb was initially somewhat constricted we did postdilated with a 12 x 40 Mustang and at completion there was good flow through both limbs.  At completion was a palpable dorsalis pedis on the left and signals at the dorsalis pedis and posterior tibial on the right and good cap refill bilaterally. ?  ?Procedure:  The patient was identified in the holding area and taken to room 16 where is placed upon upper table general anesthesia was induced.  He was sterilely prepped draped in .  The abdomen bilateral lower extremities in the usual fashion, antibiotics were minister timeout was called.  We started with the right common femoral artery and ultrasound was used to identify this.  There was some disease but higher than this we were able to cannulate this with a micropuncture needle followed by wire and sheath and a Bentson wire was placed into percutaneous access closure devices were placed.  Assistant was necessary for exchange of wires at this time.  We then placed an 8 French sheath over a Bentson  wire.  On the left side we did the same.  The common femoral artery again was diseased but higher than this there was only posterior plaque.  The artery was cannulated with a micropuncture needle followed by wire sheath and a Bentson wire was placed followed by dilation with 8 [place] dilator and percutaneous access closures x2.  We did have failure of 2 devices on the left side ultimately we did have good seal and an 8 [place] sheath was placed.  We placed a stiff wire through both sheaths and then placed a 12 French sheath on the left and 18 French sheath on the right patient was fully heparinized.  The assistant did facilitate exchange of the wires and the sheaths.  The main body device was brought through the right side to the level of L1 and Omni catheter to the left performed aortogram with 15 degrees of cranial angulation.  This demonstrated our renal arteries we deployed the device to the gate opening.  We were then able to cannulate the gate with a Berenstein catheter and a Bentson wire.  We were able to spin an Omni catheter.  We performed a completion angio to demonstrate that our renal arteries were still patent.  We then performed angiography from an RAO projection identified our left hypogastric artery and extended with 14 x 12 cm device.  Similarly on the left side we use an LAO projection and then deployed a 14 x 12 cm device.  The entire graft was then ironed with MO B balloon.  Completion demonstrated may  be some residual stenosis of the left limb and this was ballooned with a 12 x 40 mm balloon at nominal pressure.  Completion demonstrated no residual stenosis.  There were no endoleak's.  Satisfied with this we changed for Bentson wires bilaterally first close the left groin which closed well in the right groin which also closed well.  There is a palpable left dorsalis pedis and a signal at the dorsalis pedis on the right.  I elected to leave the right side no further operation.  The closure vices  were trimmed the groins were closed.  Patient was awakened from anesthesia having tolerated procedure without any complication.  All counts were correct at completion. ? ?EBL: 100 cc ? ? ? ?Vira Chaplin C. Randie Heinz, MD ?[facility] ?Office: [phone] ?Pager: [phone] ? ? ? ?

## 2022-02-26 NOTE — Progress Notes (Signed)
?  ?  Patient evaluated in the recovery area.  Unfortunately this time he has a monophasic anterior tibial signal at best where previously he had very strong anterior tibial and posterior tibial signals.  He is also having pain in the right thigh.  His right foot is motor and sensory intact but capillary refill is delayed.  Given his high burden of common femoral disease particularly on the right side I discussed with him returning to the operating room for common femoral endarterectomy and possible thrombectomy.  He is somewhat groggy from anesthesia but demonstrates good understanding.  I have also notified his daughter who is currently in the hospital waiting area and she demonstrates good understanding.  Plan to proceed urgently with right common femoral endarterectomy to restore flow to the right lower extremity.  Patient has had general anesthesia and cannot sign consent but demonstrates good understanding. ? ?Sherrine Salberg C. Randie Heinz, MD ?

## 2022-02-26 NOTE — Transfer of Care (Signed)
Immediate Anesthesia Transfer of Care Note ? ?Patient: Charles Hartman ? ?Procedure(s) Performed: ENDOVASCULAR AORTIC STENT GRAFT REPAIR (Abdomen) ?ULTRASOUND GUIDANCE FOR VASCULAR ACCESS, BILATERAL FEMORAL ARTERIES (Groin) ? ?Patient Location: PACU ? ?Anesthesia Type:General ? ?Level of Consciousness: drowsy ? ?Airway & Oxygen Therapy: Patient Spontanous Breathing and Patient connected to face mask oxygen ? ?Post-op Assessment: Report given to RN and Post -op Vital signs reviewed and stable ? ?Post vital signs: Reviewed and stable ? ?Last Vitals:  ?Vitals Value Taken Time  ?BP 134/68 02/26/22 0936  ?Temp    ?Pulse 68 02/26/22 0941  ?Resp 19 02/26/22 0941  ?SpO2 93 % 02/26/22 0941  ?Vitals shown include unvalidated device data. ? ?Last Pain:  ?Vitals:  ? 02/26/22 0604  ?TempSrc:   ?PainSc: 0-No pain  ?   ? ?  ? ?Complications: No notable events documented. ?

## 2022-02-26 NOTE — Anesthesia Postprocedure Evaluation (Addendum)
Anesthesia Post Note ? ?Patient: Charles Hartman ? ?Procedure(s) Performed: ENDOVASCULAR AORTIC STENT GRAFT REPAIR (Abdomen) ?ULTRASOUND GUIDANCE FOR VASCULAR ACCESS, BILATERAL FEMORAL ARTERIES (Groin) ? ?  ? ?Patient location during evaluation: PACU ?Anesthesia Type: General ?Level of consciousness: awake and alert ?Pain management: pain level controlled ?Vital Signs Assessment: post-procedure vital signs reviewed and stable ?Respiratory status: spontaneous breathing, nonlabored ventilation, respiratory function stable and patient connected to nasal cannula oxygen ?Cardiovascular status: blood pressure returned to baseline and stable ?Postop Assessment: no apparent nausea or vomiting ?Anesthetic complications: no ?Comments: Monophasic anterior tibial signal in PACU, changed from preop. Heading back to OR urgently. ? ? ?No notable events documented. ? ?Last Vitals:  ?Vitals:  ? 02/26/22 0945 02/26/22 0950  ?BP:  125/66  ?Pulse: 68 67  ?Resp: 17 13  ?Temp:    ?SpO2: 94% 94%  ?  ?Last Pain:  ?Vitals:  ? 02/26/22 0604  ?TempSrc:   ?PainSc: 0-No pain  ? ? ?  ?  ?  ?  ?  ?  ? ?[name] ? ? ? ? ?

## 2022-02-26 NOTE — Progress Notes (Signed)
Pt arrived to 4e from PACU. Pt oriented to room and staff. Vitals obtained and being monitored, per protocol. Bilateral groin incisions dry with skin glue. Mild bruising present. Palpable pedal pulses bilaterally. CHG bath done.  ?

## 2022-02-27 ENCOUNTER — Other Ambulatory Visit: Payer: Self-pay | Admitting: Vascular Surgery

## 2022-02-27 LAB — CBC
HCT: 38.2 % — ABNORMAL LOW (ref 39.0–52.0)
Hemoglobin: 12.8 g/dL — ABNORMAL LOW (ref 13.0–17.0)
MCH: 27.9 pg (ref 26.0–34.0)
MCHC: 33.5 g/dL (ref 30.0–36.0)
MCV: 83.2 fL (ref 80.0–100.0)
Platelets: 270 10*3/uL (ref 150–400)
RBC: 4.59 MIL/uL (ref 4.22–5.81)
RDW: 14.2 % (ref 11.5–15.5)
WBC: 12.3 10*3/uL — ABNORMAL HIGH (ref 4.0–10.5)
nRBC: 0 % (ref 0.0–0.2)

## 2022-02-27 LAB — BASIC METABOLIC PANEL
Anion gap: 10 (ref 5–15)
BUN: 8 mg/dL (ref 8–23)
CO2: 23 mmol/L (ref 22–32)
Calcium: 8.9 mg/dL (ref 8.9–10.3)
Chloride: 105 mmol/L (ref 98–111)
Creatinine, Ser: 0.83 mg/dL (ref 0.61–1.24)
GFR, Estimated: >60 mL/min (ref >60)
Glucose, Bld: 117 mg/dL — ABNORMAL HIGH (ref 70–99)
Potassium: 3.6 mmol/L (ref 3.5–5.1)
Sodium: 138 mmol/L (ref 135–145)

## 2022-02-27 MED ORDER — OXYCODONE-ACETAMINOPHEN 5-325 MG PO TABS: 1.0000 | ORAL_TABLET | ORAL | 0 refills | Status: DC | PRN

## 2022-02-27 MED ORDER — HYDROCODONE-ACETAMINOPHEN 5-325 MG PO TABS: 1.0000 | ORAL_TABLET | Freq: Four times a day (QID) | ORAL | 0 refills | Status: DC | PRN

## 2022-02-27 NOTE — Progress Notes (Addendum)
?  Progress Note ? ? ? ?02/27/2022 ?8:18 AM ?1 Day Post-Op ? ?Subjective: Wants to go home.  Denies new abdominal or back pain.  Denies rest pain right foot ? ? ?Vitals:  ? 02/27/22 0200 02/27/22 0401  ?BP: (!) 141/68 (!) 149/78  ?Pulse:    ?Resp: 17 16  ?Temp:  97.6 ?F (36.4 ?C)  ?SpO2: 95% 94%  ? ?Physical Exam: ?Lungs: Nonlabored ?Incisions: Left groin access site without firm hematoma; right groin incision clean dry and intact ?Extremities: Symmetrical DP pulses ?Abdomen: Soft, nontender, nondistended ?Neurologic: A&O ? ?CBC ?   ?Component Value Date/Time  ? WBC 12.3 (H) 02/27/2022 0112  ? RBC 4.59 02/27/2022 0112  ? HGB 12.8 (L) 02/27/2022 0112  ? HCT 38.2 (L) 02/27/2022 0112  ? PLT 270 02/27/2022 0112  ? MCV 83.2 02/27/2022 0112  ? MCH 27.9 02/27/2022 0112  ? MCHC 33.5 02/27/2022 0112  ? RDW 14.2 02/27/2022 0112  ? ? ?BMET ?   ?Component Value Date/Time  ? NA 138 02/27/2022 0112  ? K 3.6 02/27/2022 0112  ? CL 105 02/27/2022 0112  ? CO2 23 02/27/2022 0112  ? GLUCOSE 117 (H) 02/27/2022 0112  ? BUN 8 02/27/2022 0112  ? CREATININE 0.83 02/27/2022 0112  ? CALCIUM 8.9 02/27/2022 0112  ? GFRNONAA >60 02/27/2022 0112  ? ? ?INR ?   ?Component Value Date/Time  ? INR 1.3 (H) 02/26/2022 0951  ? ? ? ?Intake/Output Summary (Last 24 hours) at 02/27/2022 0818 ?Last data filed at 02/27/2022 [phone] ?Gross per 24 hour  ?Intake 2369.32 ml  ?Output 2305 ml  ?Net 64.32 ml  ? ? ? ?Assessment/Plan:  76 y.o. male is s/p EVAR with repair of right common femoral artery 1 Day Post-Op  ? ?Right leg well-perfused with palpable DP pulse ?Incisions are healing well ?Plan is for discharge home today with a CTA abdomen and pelvis in 3 to 4 weeks to see Dr. Randie Heinz ? ? ?Emilie Rutter, PA-C ?[facility] ?[phone] ?02/27/2022 ?8:18 AM ? ? ?VASCULAR STAFF ADDENDUM: ?I have independently interviewed and examined the patient. ?I agree with the above.  ? ? ?J. Gillis Santa, MD ?[facility] ?Office Phone Number:  [phone] ?02/27/2022 9:20 AM ? ? ?

## 2022-02-27 NOTE — Evaluation (Signed)
Physical Therapy Evaluation & Discharge ?Patient Details ?Name: Charles Hartman ?MRN: [id number] ?DOB: 1946-07-23 ?Today's Date: 02/27/2022 ? ?History of Present Illness ? Pt is a 76 y.o. male who presented 02/24/22 with abdominal aortic aneurysm. S/p endovascular aneurysm repair 3/3 with noted loss of signals and pain in R leg thus s/p repair of R common femoral artery 3/3. PMH: AAA, GERD ?  ?Clinical Impression ? Pt presents with condition above. Pt lives alone in a 1-level house with 4 STE and is IND at baseline. Pt is currently functioning at his baseline, not needing any assistance with mobility without DME or displaying any deficits in strength, sensation, or balance. All education completed and questions answered. PT will sign off. ?   ? ?Recommendations for follow up therapy are one component of a multi-disciplinary discharge planning process, led by the attending physician.  Recommendations may be updated based on patient status, additional functional criteria and insurance authorization. ? ?Follow Up Recommendations No PT follow up ? ?  ?Assistance Recommended at Discharge None  ?Patient can return home with the following ?  (N/A) ? ?  ?Equipment Recommendations None recommended by PT  ?Recommendations for Other Services ?    ?  ?Functional Status Assessment Patient has not had a recent decline in their functional status  ? ?  ?Precautions / Restrictions Precautions ?Precautions: None ?Restrictions ?Weight Bearing Restrictions: No  ? ?  ? ?Mobility ? Bed Mobility ?Overal bed mobility: Modified Independent ?  ?  ?  ?  ?  ?  ?General bed mobility comments: Pt able to perform all bed mob quickly with HOB slightly elevated ?  ? ?Transfers ?Overall transfer level: Independent ?Equipment used: None ?  ?  ?  ?  ?  ?  ?  ?General transfer comment: No LOB or assistance needed ?  ? ?Ambulation/Gait ?Ambulation/Gait assistance: Independent ?Gait Distance (Feet): 325 Feet ?Assistive device: None ?Gait Pattern/deviations:  WFL(Within Functional Limits) ?Gait velocity: WNL ?Gait velocity interpretation: >4.37 ft/sec, indicative of normal walking speed ?  ?General Gait Details: Pt with steady gait, changing speeds appropriately without LOB. ? ?Stairs ?Stairs: Yes ?Stairs assistance: Modified independent (Device/Increase time) ?Stair Management: One rail Left, Alternating pattern, Forwards, Two rails ?Number of Stairs: 11 ?General stair comments: Ascends with L rail and descends with bil rails, no LOB. ? ?Wheelchair Mobility ?  ? ?Modified Rankin (Stroke Patients Only) ?  ? ?  ? ?Balance Overall balance assessment: No apparent balance deficits (not formally assessed) ?  ?  ?  ?  ?  ?  ?  ?  ?  ?  ?  ?  ?  ?  ?  ?  ?  ?  ?   ? ? ? ?Pertinent Vitals/Pain Pain Assessment ?Pain Assessment: No/denies pain  ? ? ?Home Living Family/patient expects to be discharged to:: Private residence ?Living Arrangements: Alone ?Available Help at Discharge: Family;Available 24 hours/day ?Type of Home: House ?Home Access: Stairs to enter ?Entrance Stairs-Rails: Left;Right;Can reach both ?Entrance Stairs-Number of Steps: 4 ?  ?Home Layout: One level ?Home Equipment: Agricultural consultant (2 wheels);Crutches;Grab bars - tub/shower ?   ?  ?Prior Function Prior Level of Function : Driving;Independent/Modified Independent ?  ?  ?  ?  ?  ?  ?Mobility Comments: Does not use AD. No recent falls. ?ADLs Comments: retired ?  ? ? ?Hand Dominance  ?   ? ?  ?Extremity/Trunk Assessment  ? Upper Extremity Assessment ?Upper Extremity Assessment: Overall WFL for tasks assessed ?  ? ?  Lower Extremity Assessment ?Lower Extremity Assessment: Overall WFL for tasks assessed (denies numbness/tingling with touch) ?  ? ?Cervical / Trunk Assessment ?Cervical / Trunk Assessment: Normal  ?Communication  ? Communication: No difficulties  ?Cognition Arousal/Alertness: Awake/alert ?Behavior During Therapy: [facility] for tasks assessed/performed ?Overall Cognitive Status: Within Functional Limits for  tasks assessed ?  ?  ?  ?  ?  ?  ?  ?  ?  ?  ?  ?  ?  ?  ?  ?  ?  ?  ?  ? ?  ?General Comments   ? ?  ?Exercises    ? ?Assessment/Plan  ?  ?PT Assessment Patient does not need any further PT services  ?PT Problem List   ? ?   ?  ?PT Treatment Interventions     ? ?PT Goals (Current goals can be found in the Care Plan section)  ?Acute Rehab PT Goals ?Patient Stated Goal: to go home ?PT Goal Formulation: All assessment and education complete, DC therapy ?Time For Goal Achievement: 02/28/22 ?Potential to Achieve Goals: Good ? ?  ?Frequency   ?  ? ? ?Co-evaluation   ?  ?  ?  ?  ? ? ?  ?AM-PAC PT "6 Clicks" Mobility  ?Outcome Measure Help needed turning from your back to your side while in a flat bed without using bedrails?: None ?Help needed moving from lying on your back to sitting on the side of a flat bed without using bedrails?: None ?Help needed moving to and from a bed to a chair (including a wheelchair)?: None ?Help needed standing up from a chair using your arms (e.g., wheelchair or bedside chair)?: None ?Help needed to walk in hospital room?: None ?Help needed climbing 3-5 steps with a railing? : None ?6 Click Score: 24 ? ?  ?End of Session   ?Activity Tolerance: Patient tolerated treatment well ?Patient left: in bed;with call bell/phone within reach ?Nurse Communication: Mobility status ?PT Visit Diagnosis: Other (comment) (stiffness) ?  ? ?Time: 1053-1101 ?PT Time Calculation (min) (ACUTE ONLY): 8 min ? ? ?Charges:   PT Evaluation ?$PT Eval Low Complexity: 1 Low ?  ?  ?   ? ? ?Charles Hartman, PT, DPT ?Acute Rehabilitation Services  ?Pager: [phone] ?Office: [phone] ? ? ?Charles Hartman ?02/27/2022, 11:09 AM ? ?

## 2022-02-27 NOTE — Progress Notes (Signed)
Patient discharging home with support from his family. Ivs removed without complications. Tele removed and CCMD notified. Discharge instructions completed and medication administration discussed. All questions answered. Patient getting dressed and waiting for ride.  ? ?Charles Hartman  ?

## 2022-02-27 NOTE — Discharge Summary (Signed)
?EVAR Discharge Summary ? ? ?Charles Hartman ?08-Apr-1946 76 y.o. male ? ?MRN: ?[id number] ? ?Admission Date: ?02/26/2022 ? ?Discharge Date: ?02/27/22 ? ?Physician: ?Juventino Slovak* ? ?Admission Diagnosis: ?AAA (abdominal aortic aneurysm) [I71.40] ? ?Discharge Day services:   ?See progress note 02/27/2022 ? ?Hospital Course:  ?The patient was admitted to the hospital and taken to the operating room on 02/26/2022 and underwent endovascular repair of abdominal aortic aneurysm by Dr. Randie Heinz on 02/26/2022.  Postoperatively he was noted to have diminished pulses of his right foot.  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  POD 1 right leg is well-perfused.  He has symmetrical DP pulses.  He is ambulating without difficulty and denies new or changing abdominal or back pain.  Left groin catheterization site also without bleeding or firm hematoma.  He will follow-up in office in about 3 to 4 weeks with a CTA abdomen and pelvis.  He will be discharged with 1 to 2 days of narcotic pain medication for continued postoperative pain control.  He will be discharged home in stable condition. ? ?CBC ?   ?Component Value Date/Time  ? WBC 12.3 (H) 02/27/2022 0112  ? RBC 4.59 02/27/2022 0112  ? HGB 12.8 (L) 02/27/2022 0112  ? HCT 38.2 (L) 02/27/2022 0112  ? PLT 270 02/27/2022 0112  ? MCV 83.2 02/27/2022 0112  ? MCH 27.9 02/27/2022 0112  ? MCHC 33.5 02/27/2022 0112  ? RDW 14.2 02/27/2022 0112  ? ? ?BMET ?   ?Component Value Date/Time  ? NA 138 02/27/2022 0112  ? K 3.6 02/27/2022 0112  ? CL 105 02/27/2022 0112  ? CO2 23 02/27/2022 0112  ? GLUCOSE 117 (H) 02/27/2022 0112  ? BUN 8 02/27/2022 0112  ? CREATININE 0.83 02/27/2022 0112  ? CALCIUM 8.9 02/27/2022 0112  ? GFRNONAA >60 02/27/2022 0112  ? ? ? ? ? ? ? ?Discharge Diagnosis:  ?AAA (abdominal aortic aneurysm) [I71.40] ? ?Secondary Diagnosis: ?Patient Active Problem  List  ? Diagnosis Date Noted  ? AAA (abdominal aortic aneurysm) 02/26/2022  ? ?Past Medical History:  ?Diagnosis Date  ? AAA (abdominal aortic aneurysm)   ? Arthritis   ? Enlarged prostate   ? GERD (gastroesophageal reflux disease)   ? ? ? ?Allergies as of 02/27/2022   ? ?   Reactions  ? Novocain [procaine] Anaphylaxis  ? ?  ? ?  ?Medication List  ?  ? ?TAKE these medications   ? ?ANTI GAS PO ?Take 2 tablets by mouth daily as needed (gas). ?  ?aspirin EC 81 MG tablet ?Take 162 mg by mouth every evening. ?  ?calcium carbonate 500 MG chewable tablet ?Commonly known as: TUMS - dosed in mg elemental calcium ?Chew 1,000 mg by mouth daily as needed for indigestion or heartburn. ?  ?dutasteride 0.5 MG capsule ?Commonly known as: AVODART ?Take 0.5 mg by mouth daily. ?  ?fenofibrate micronized 67 MG capsule ?Commonly known as: LOFIBRA ?Take 67 mg by mouth daily. ?  ?HYDROcodone-acetaminophen 5-325 MG tablet ?Commonly known as: Norco ?Take 1 tablet by mouth every 6 (six) hours as needed for moderate pain. ?  ?lovastatin 40 MG tablet ?Commonly known as: MEVACOR ?Take 40 mg by mouth at bedtime. ?  ?mometasone 50 MCG/ACT nasal spray ?Commonly known as: NASONEX ?Place 1 spray into the nose daily. ?  ?RABEprazole 20 MG tablet ?Commonly known as: ACIPHEX ?Take 20 mg  by mouth daily. ?  ?tamsulosin 0.4 MG Caps capsule ?Commonly known as: FLOMAX ?Take 0.8 mg by mouth at bedtime. ?  ?temazepam 15 MG capsule ?Commonly known as: RESTORIL ?Take 15 mg by mouth at bedtime. ?  ?VISINE OP ?Place 1 drop into both eyes daily as needed (dry eyes). ?  ? ?  ? ? ?Discharge Instructions:  ? ?[facility]  ?Discharge Instructions Endovascular Aortic Aneurysm Repair ? ?Please refer to the following instructions for your post-procedure care. Your surgeon or Physician Assistant will discuss any changes with you. ? ?Activity ? ?You are encouraged to walk as much as you can. You can slowly return to normal activities but must  avoid strenuous activity and heavy lifting until your doctor tells you it's OK. Avoid activities such as vacuuming or swinging a gold club. It is normal to feel tired for several weeks after your surgery. Do not drive until your doctor gives the OK and you are no longer taking prescription pain medications. It is also normal to have difficulty with sleep habits, eating, and bowel movements after surgery. These will go away with time. ? ?Bathing/Showering ? ?You may shower after you go home. If you have an incision, do not soak in a bathtub, hot tub, or swim until the incision heals completely. ? ?Incision Care ? ?Shower every day. Clean your incision with mild soap and water. Pat the area dry with a clean towel. You do not need a bandage unless otherwise instructed. Do not apply any ointments or creams to your incision. If you clothing is irritating, you may cover your incision with a dry gauze pad. ? ?Diet ? ?Resume your normal diet. There are no special food restrictions following this procedure. A low fat/low cholesterol diet is recommended for all patients with vascular disease. In order to heal from your surgery, it is CRITICAL to get adequate nutrition. Your body requires vitamins, minerals, and protein. Vegetables are the best source of vitamins and minerals. Vegetables also provide the perfect balance of protein. Processed food has little nutritional value, so try to avoid this. ? ?Medications ? ?Resume taking all of your medications unless your doctor or Physician Assistnat tells you not to. If your incision is causing pain, you may take over-the-counter pain relievers such as acetaminophen (Tylenol). If you were prescribed a stronger pain medication, please be aware these medications can cause nausea and constipation. Prevent nausea by taking the medication with a snack or meal. Avoid constipation by drinking plenty of fluids and eating foods with a high amount of fiber, such as fruits, vegetables, and  grains. Do not take Tylenol if you are taking prescription pain medications. ? ? ?Follow up ? ?Our office will schedule a follow-up appointment with a C.T. scan 3-4 weeks after your surgery. ? ?Please call us immediately for any of the following conditions ? ?Severe or worsening pain in your legs or feet or in your abdomen back or chest. ?Increased pain, redness, drainage (pus) from your incision sit. ?Increased abdominal pain, bloating, nausea, vomiting or persistent diarrhea. ?Fever of 101 degrees or higher. ?Swelling in your leg (s), ? ?Reduce your risk of vascular disease ? ?Stop smoking. If you would like help call [facility] at 1-800-QUIT-NOW (731-738-0309) or [place] at [phone]. ?Manage your cholesterol ?Maintain a desired weight ?Control your diabetes ?Keep your blood pressure down ? ?If you have questions, please call the office at [phone]. ? ? ? ? ?Disposition: home ? ?Patient's condition: is Good ? ?  Follow up: ?1. Dr. Randie Heinz in 4 weeks with CTA protocol ? ? ?Emilie Rutter, PA-C ?[facility] ?[phone] ?02/27/2022  ?9:00 AM ? ? ?- For VQI Registry use - ?Post-op:  ?Time to Extubation: [x]  In OR, [ ]  < 12 hrs, [ ]  12-24 hrs, [ ]  >=24 hrs ?Vasopressors Req. Post-op: No ?MI: No., [ ]  Troponin only, [ ]  EKG or Clinical ?New Arrhythmia: No ?CHF: No ?ICU Stay: 0 days ?Transfusion: No    ? ?Complications: ?Resp failure: No., [ ]  Pneumonia, [ ]  Ventilator ?Chg in renal function: No., [ ]  Inc. Cr > 0.5, [ ]  Temp. Dialysis, ? [ ]  Permanent dialysis ?Leg ischemia: Yes.  , no Surgery needed, [x ] Yes, Surgery needed, ? [ ]  Amputation ?Bowel ischemia: No., [ ]  Medical Rx, [ ]  Surgical Rx ?Wound complication: No., [ ]  Superficial separation/infection, [ ]  Return to OR ?Return to OR: Yes  ?Return to OR for bleeding: No ?Stroke: No., [ ]  Minor, [ ]  Major ? ?Discharge medications: ?Statin use:  Yes  ?ASA use:  Yes  ?Plavix use:  No  ?Beta blocker use:  No  ?ARB use:  No ?ACEI use:   No ?CCB use:  No ? ? ?

## 2022-03-01 ENCOUNTER — Encounter (HOSPITAL_COMMUNITY): Payer: Self-pay | Admitting: Vascular Surgery

## 2022-03-15 ENCOUNTER — Other Ambulatory Visit: Payer: Self-pay

## 2022-03-15 DIAGNOSIS — I714 Abdominal aortic aneurysm, without rupture, unspecified: Secondary | ICD-10-CM

## 2022-03-19 ENCOUNTER — Other Ambulatory Visit: Payer: Self-pay | Admitting: Vascular Surgery

## 2022-04-07 ENCOUNTER — Ambulatory Visit
Admission: RE | Admit: 2022-04-07 | Discharge: 2022-04-07 | Disposition: A | Discharge status: Home / Self Care | Payer: Medicare Other | Source: Ambulatory Visit | Attending: Vascular Surgery | Admitting: Vascular Surgery

## 2022-04-07 DIAGNOSIS — I714 Abdominal aortic aneurysm, without rupture, unspecified: Secondary | ICD-10-CM

## 2022-04-07 MED ORDER — IOPAMIDOL (ISOVUE-370) INJECTION 76%
100.0000 mL | Freq: Once | INTRAVENOUS | Status: AC | PRN
  Administered 2022-04-07: 100 mL via INTRAVENOUS

## 2022-04-14 ENCOUNTER — Encounter: Payer: Self-pay | Admitting: Vascular Surgery

## 2022-04-14 ENCOUNTER — Ambulatory Visit (INDEPENDENT_AMBULATORY_CARE_PROVIDER_SITE_OTHER): Payer: Medicare Other | Admitting: Vascular Surgery

## 2022-04-14 VITALS — BP 132/84 | HR 63 | Temp 98.2°F | Resp 20 | Ht 68.0 in | Wt 199.0 lb

## 2022-04-14 DIAGNOSIS — I7143 Infrarenal abdominal aortic aneurysm, without rupture: Secondary | ICD-10-CM

## 2022-04-14 NOTE — Progress Notes (Signed)
?  ? ?  ?  Subjective:  ?  ?HPI 76 year old male status post endovascular aneurysm repair complicated by need for right common femoral endarterectomy.  He does not have any leg symptoms at this time states that his groin is well-healed.  No new back or abdominal pain. ?  ?  ?Review of Systems ?No complaints ?  ?  ?Objective:  ?  ?Objective  ? Physical Exam ?   ?Vitals:  ?  04/14/22 1329  ?BP: 132/84  ?Pulse: 63  ?Resp: 20  ?Temp: 98.2 ?F (36.8 ?C)  ?SpO2: 93%  ?  ?Awake alert oriented ?Nonlabored respirations ?Well healed right groin wound ?Bilateral dorsalis pedis pulses are 2+ palpable ?  ?  ?  ?CT IMPRESSION: ?Thoracic aortic atherosclerosis without thoracic aortic aneurysm, ?dissection, or other acute aortic process. ?  ?Negative for acute pulmonary embolus ?  ?Native coronary atherosclerosis ?  ?5 mm peripheral subpleural right lower lobe pulmonary nodule. ?  ?No follow-up needed if patient is low-risk.This recommendation ?follows the consensus statement: Guidelines for Management of ?Incidental Pulmonary Nodules Detected on CT Images: From the ?Fleischner Society 2017; Radiology 2017; [phone]. ?  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 Native aneurysm sac diameter is slightly smaller. Measurements ?as above. Negative for endoleak. ?  ?No other acute intra-abdominal or pelvic vascular finding. ?  ?Colonic diverticulosis without acute inflammatory process. ?  ?Similar marked prostate enlargement. ?  ?Assessment:  ?  ?Assessment ?   ?76 year old male status post endovascular aneurysm repair with right common femoral endarterectomy and patch angioplasty now with palpable dorsalis pedis pulses bilaterally and what appears to be a well sealed aneurysm on CT scan. ?  ?  ?Plan:  ?  ?Plan ?   ?1 year follow-up with EVAR duplex. ?  ?  ?Blondie Riggsbee C. Donzetta Matters, MD ?[facility] ?Office: [phone] ?Pager: [phone] ?

## 2022-04-14 NOTE — Progress Notes (Deleted)
?  ? ?  Subjective:  ?  ? Patient ID: Charles Hartman, male   DOB: Sep 03, 1946, 76 y.o.   MRN: [id number] ? ?HPI 76 year old male status post endovascular aneurysm repair complicated by need for right common femoral endarterectomy.  He does not have any leg symptoms at this time states that his groin is well-healed.  No new back or abdominal pain. ? ? ?Review of Systems ?No complaints ?   ?Objective:  ? Physical Exam ?Vitals:  ? 04/14/22 1329  ?BP: 132/84  ?Pulse: 63  ?Resp: 20  ?Temp: 98.2 ?F (36.8 ?C)  ?SpO2: 93%  ? ?Awake alert oriented ?Nonlabored respirations ?Well healed right groin wound ?Bilateral dorsalis pedis pulses are 2+ palpable ?   ? ?CT IMPRESSION: ?Thoracic aortic atherosclerosis without thoracic aortic aneurysm, ?dissection, or other acute aortic process. ?  ?Negative for acute pulmonary embolus ?  ?Native coronary atherosclerosis ?  ?5 mm peripheral subpleural right lower lobe pulmonary nodule. ?  ?No follow-up needed if patient is low-risk.This recommendation ?follows the consensus statement: Guidelines for Management of ?Incidental Pulmonary Nodules Detected on CT Images: From the ?Fleischner Society 2017; Radiology 2017; [id number]. ?  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 Native aneurysm sac diameter is slightly smaller. Measurements ?as above. Negative for endoleak. ?  ?No other acute intra-abdominal or pelvic vascular finding. ?  ?Colonic diverticulosis without acute inflammatory process. ?  ?Similar marked prostate enlargement. ?  ?Assessment:  ?   ?76 year old male status post endovascular aneurysm repair with right common femoral endarterectomy and patch angioplasty now with palpable dorsalis pedis pulses bilaterally and what appears to be a well sealed aneurysm on CT scan. ?   ?Plan:  ?   ?1 year follow-up with EVAR duplex. ?   ?Kyston Gonce C. Randie Heinz, MD ?[facility] ?Office: [phone] ?Pager: [phone] ? ?

## 2023-02-02 IMAGING — DX DG CHEST 1V PORT
1 series · 1 of 1 positions shown · non-contrast
Comparison: Chest radiograph 04/08/2017

CLINICAL DATA: Postop AAA repair

EXAM:
PORTABLE CHEST 1 VIEW

[chest]
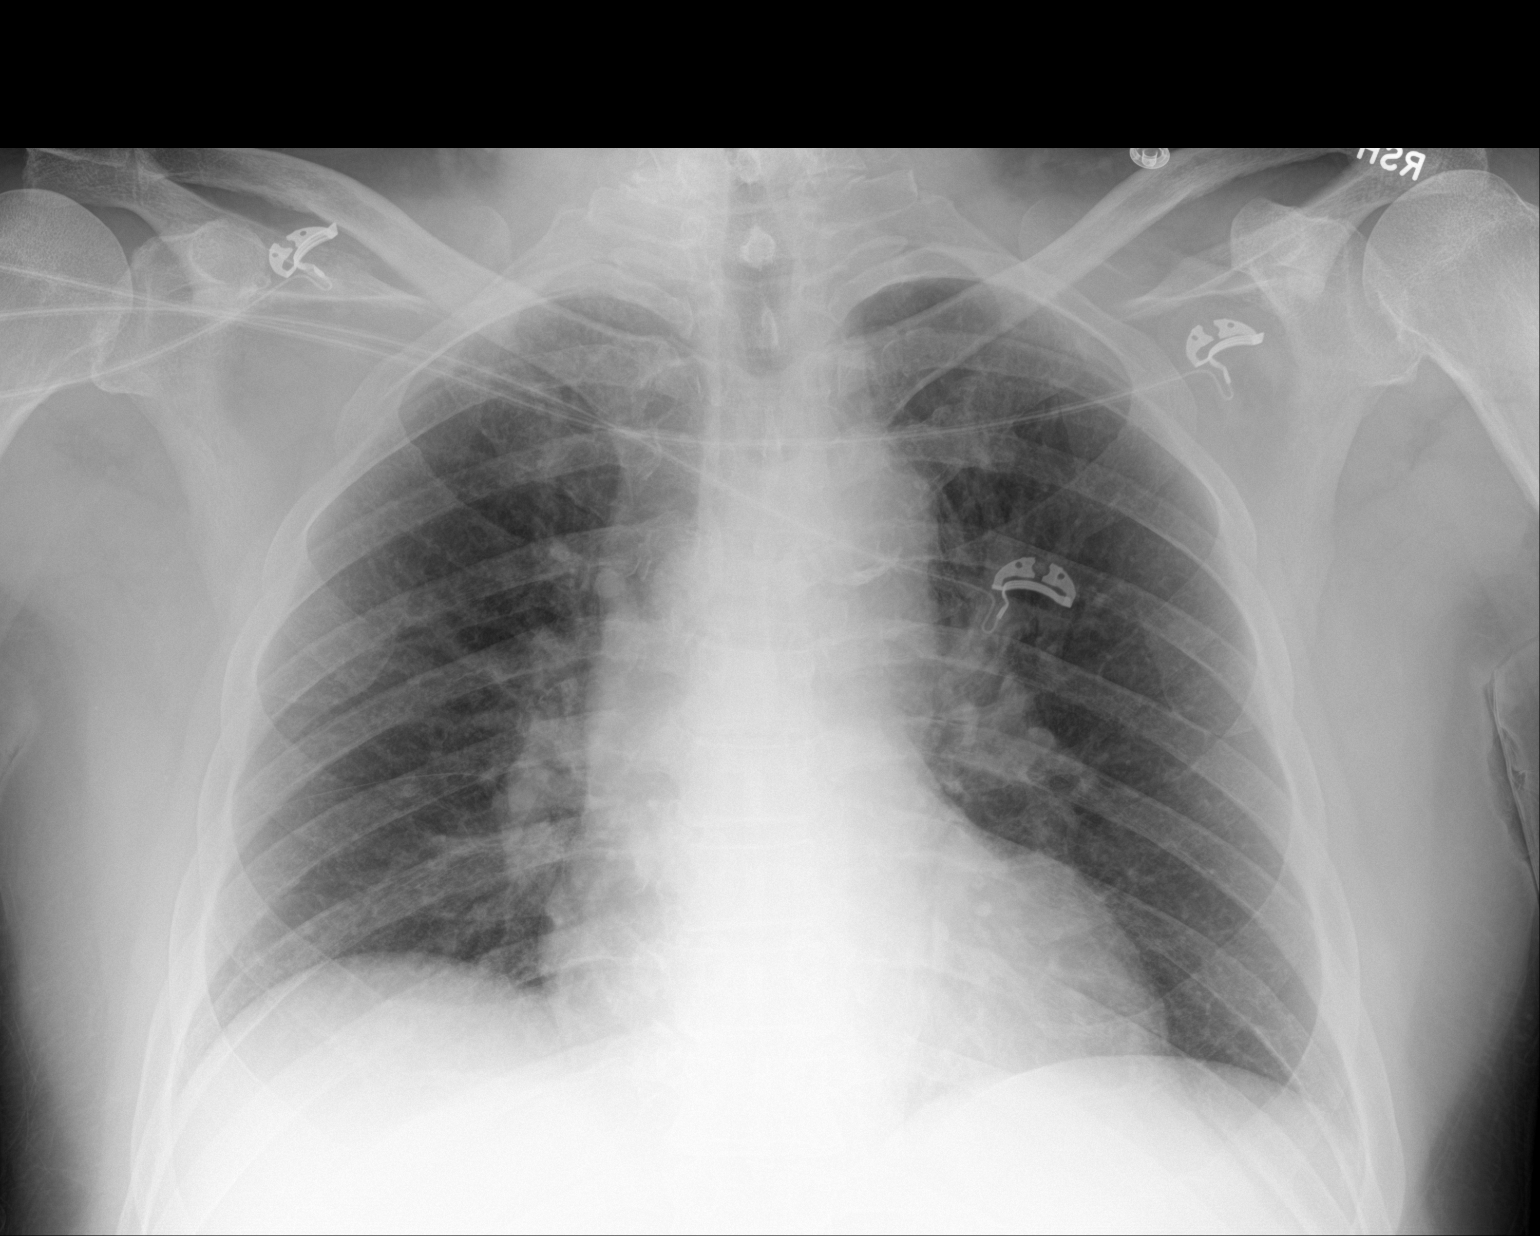

[1 of 1 positions shown; findings below may reference images not displayed]

FINDINGS: The heart is at the upper limits of normal for size. The upper
mediastinal contours are normal.

There is no focal consolidation or pulmonary edema. There is no
pleural effusion or pneumothorax.

There is no acute osseous abnormality.
IMPRESSION: No radiographic evidence of acute cardiopulmonary process.

## 2023-10-25 ENCOUNTER — Other Ambulatory Visit: Payer: Self-pay | Admitting: *Deleted

## 2023-10-25 DIAGNOSIS — Z9889 Other specified postprocedural states: Secondary | ICD-10-CM

## 2023-11-02 ENCOUNTER — Ambulatory Visit (HOSPITAL_COMMUNITY)
Admission: RE | Admit: 2023-11-02 | Discharge: 2023-11-02 | Disposition: A | Discharge status: Home / Self Care | Payer: Medicare Other | Source: Ambulatory Visit | Attending: Vascular Surgery | Admitting: Vascular Surgery

## 2023-11-02 ENCOUNTER — Ambulatory Visit (INDEPENDENT_AMBULATORY_CARE_PROVIDER_SITE_OTHER): Payer: Medicare Other | Admitting: Physician Assistant

## 2023-11-02 VITALS — BP 155/80 | HR 62 | Temp 98.1°F | Ht 68.0 in | Wt 198.3 lb

## 2023-11-02 DIAGNOSIS — I714 Abdominal aortic aneurysm, without rupture, unspecified: Secondary | ICD-10-CM | POA: Diagnosis not present

## 2023-11-02 DIAGNOSIS — Z9889 Other specified postprocedural states: Secondary | ICD-10-CM | POA: Diagnosis present

## 2023-11-02 NOTE — Progress Notes (Signed)
Office Note   History of Present Illness   Charles Hartman is a 77 y.o. (1946-12-15) male who presents for follow up of EVAR.  He has a history of AAA with maximum measurement 5.2 cm.  He underwent endovascular repair on 02/26/2022 by Dr. Randie Heinz.  He was taken back to the OR shortly after his surgery for right lower extremity limb ischemia.  He had a right common femoral endarterectomy with patch angioplasty.  He returns today for follow-up.  He denies any changes to his health in the past year.  He is still taking his daily aspirin and statin.  He denies any abdominal, chest, or back pain.  He also denies any claudication, rest pain, tissue loss.   Current Outpatient Medications  Medication Sig Dispense Refill   aspirin EC 81 MG tablet Take 162 mg by mouth every evening.     calcium carbonate (TUMS - DOSED IN MG ELEMENTAL CALCIUM) 500 MG chewable tablet Chew 1,000 mg by mouth daily as needed for indigestion or heartburn.     dutasteride (AVODART) 0.5 MG capsule Take 0.5 mg by mouth daily.     fenofibrate micronized (LOFIBRA) 67 MG capsule Take 67 mg by mouth daily.     lovastatin (MEVACOR) 40 MG tablet Take 40 mg by mouth at bedtime.     mometasone (NASONEX) 50 MCG/ACT nasal spray Place 1 spray into the nose daily.     oxyCODONE-acetaminophen (PERCOCET/ROXICET) 5-325 MG tablet Take 1 tablet by mouth every 4 (four) hours as needed for severe pain. (Patient not taking: Reported on 11/02/2023) 15 tablet 0   RABEprazole (ACIPHEX) 20 MG tablet Take 20 mg by mouth daily.     Simethicone (ANTI GAS PO) Take 2 tablets by mouth daily as needed (gas).     tamsulosin (FLOMAX) 0.4 MG CAPS capsule Take 0.8 mg by mouth at bedtime.     temazepam (RESTORIL) 15 MG capsule Take 15 mg by mouth at bedtime.     Tetrahydrozoline HCl (VISINE OP) Place 1 drop into both eyes daily as needed (dry eyes).     No current facility-administered medications for this visit.    REVIEW OF SYSTEMS (negative unless checked):    Cardiac:  []  Chest pain or chest pressure? []  Shortness of breath upon activity? []  Shortness of breath when lying flat? []  Irregular heart rhythm?  Vascular:  []  Pain in calf, thigh, or hip brought on by walking? []  Pain in feet at night that wakes you up from your sleep? []  Blood clot in your veins? []  Leg swelling?  Pulmonary:  []  Oxygen at home? []  Productive cough? []  Wheezing?  Neurologic:  []  Sudden weakness in arms or legs? []  Sudden numbness in arms or legs? []  Sudden onset of difficult speaking or slurred speech? []  Temporary loss of vision in one eye? []  Problems with dizziness?  Gastrointestinal:  []  Blood in stool? []  Vomited blood?  Genitourinary:  []  Burning when urinating? []  Blood in urine?  Psychiatric:  []  Major depression  Hematologic:  []  Bleeding problems? []  Problems with blood clotting?  Dermatologic:  []  Rashes or ulcers?  Constitutional:  []  Fever or chills?  Ear/Nose/Throat:  []  Change in hearing? []  Nose bleeds? []  Sore throat?  Musculoskeletal:  []  Back pain? []  Joint pain? []  Muscle pain?   Physical Examination   Vitals:   11/02/23 1009  BP: (!) 155/80  Pulse: 62  Temp: 98.1 F (36.7 C)  SpO2: 92%  Weight: 198 lb 4.8  oz (89.9 kg)  Height: 5\' 8"  (1.727 m)   Body mass index is 30.15 kg/m.  General:  WDWN in NAD; vital signs documented above Gait: Not observed HENT: WNL, normocephalic Pulmonary: normal non-labored breathing , without rales, rhonchi,  wheezing Cardiac: regular Abdomen: soft, NT, no masses Skin: without rashes Vascular Exam/Pulses: 2+ palpable pedal pulses Extremities: without ischemic changes, without gangrene , without cellulitis; without open wounds;  Musculoskeletal: no muscle wasting or atrophy  Neurologic: A&O X 3;  No focal weakness or paresthesias are detected Psychiatric:  The pt has Normal affect.   Non-Invasive Vascular Imaging   EVAR Duplex (11/02/2023) Current size: 4.76  cm Previous size: 5.2 cm (by CTA 03/2022) R CIA: 1.6 cm L CIA: 1.7 cm   Medical Decision Making   Charles Hartman is a 77 y.o. (17-Aug-1946) male who presents for surveillance of EVAR  Based on this patient's duplex, his endovascular aortic stent graft repair is patent without endoleak.  His AAA has decreased in size since repair from 5.2 cm to 4.76cm The patient was taken back to the OR on the same day after his EVAR for an ischemic right lower extremity.  He underwent right common femoral endarterectomy.  His right lower extremity is warm and well-perfused.  He denies any rest pain, claudication, or tissue loss On exam he has palpable pedal pulses bilaterally  He denies any abdominal, chest, or back pain He can follow-up with our office in 1 year with repeat EVAR duplex   Loel Dubonnet PA-C [facility] [place] Office: [phone]  Clinic MD: Randie Heinz

## 2023-11-17 ENCOUNTER — Other Ambulatory Visit: Payer: Self-pay

## 2023-11-17 DIAGNOSIS — I714 Abdominal aortic aneurysm, without rupture, unspecified: Secondary | ICD-10-CM

## 2024-07-02 ENCOUNTER — Other Ambulatory Visit: Payer: Self-pay | Admitting: Urology

## 2024-07-10 NOTE — Patient Instructions (Signed)
 SURGICAL WAITING ROOM VISITATION Patients having surgery or a procedure may have no more than 2 support people in the waiting area - these visitors may rotate in the visitor waiting room.   If the patient needs to stay at the hospital during part of their recovery, the visitor guidelines for inpatient rooms apply.  PRE-OP VISITATION  Pre-op nurse will coordinate an appropriate time for 1 support person to accompany the patient in pre-op.  This support person may not rotate.  This visitor will be contacted when the time is appropriate for the visitor to come back in the pre-op area.  Please refer to the [facility] website for the visitor guidelines for Inpatients (after your surgery is over and you are in a regular room).  You are not required to quarantine at this time prior to your surgery. However, you must do this: Hand Hygiene often Do NOT share personal items Notify your provider if you are in close contact with someone who has COVID or you develop fever 100.4 or greater, new onset of sneezing, cough, sore throat, shortness of breath or body aches.  If you test positive for Covid or have been in contact with anyone that has tested positive in the last 10 days please notify you surgeon.  Your procedure is scheduled on:  [facility] July 18, 2024  Report to [facility] Main Entrance: [place] entrance where the [employer] is available.   Report to admitting at: 05:15    AM  Call this number if you have any questions or problems the morning of surgery [phone]  DO NOT EAT OR DRINK ANYTHING AFTER MIDNIGHT THE NIGHT PRIOR TO YOUR SURGERY / PROCEDURE.   FOLLOW  ANY ADDITIONAL PRE OP INSTRUCTIONS YOU RECEIVED FROM YOUR SURGEON'S OFFICE!!!   Oral Hygiene is also important to reduce your risk of infection.        Remember - BRUSH YOUR TEETH THE MORNING OF SURGERY WITH YOUR REGULAR TOOTHPASTE  Do NOT smoke after Midnight the night before surgery.  STOP TAKING all Vitamins,  Herbs and supplements 1 week before your surgery.   FARXIGA- stop taking 72 hours before your surgery.  Last dose will be taken on Saturday 07-14-24  Take ONLY these medicines the morning of surgery with A SIP OF WATER: rabeprazole (Aciphex), finasteride, You may use your Eye drops and nasal spray if needed   You may not have any metal on your body including  jewelry, and body piercing  Do not wear  lotions, powders, cologne, or deodorant  Men may shave face and neck.  Contacts, Hearing Aids, dentures or bridgework may not be worn into surgery. DENTURES WILL BE REMOVED PRIOR TO SURGERY PLEASE DO NOT APPLY Poly grip OR ADHESIVES!!!  You may bring a small overnight bag with you on the day of surgery, only pack items that are not valuable. [facility] IS NOT RESPONSIBLE   FOR VALUABLES THAT ARE LOST OR STOLEN.   Do not bring your home medications to the hospital. The Pharmacy will dispense medications listed on your medication list to you during your admission in the Hospital.  Special Instructions: Bring a copy of your healthcare power of attorney and living will documents the day of surgery, if you wish to have them scanned into your [facility] Medical Records- EPIC  Please read over the following fact sheets you were given: IF YOU HAVE QUESTIONS ABOUT YOUR PRE-OP INSTRUCTIONS, PLEASE CALL [phone].   [facility] - Preparing for Surgery Before surgery,  you can play an important role.  Because skin is not sterile, your skin needs to be as free of germs as possible.  You can reduce the number of germs on your skin by washing with CHG (chlorahexidine gluconate) soap before surgery.  CHG is an antiseptic cleaner which kills germs and bonds with the skin to continue killing germs even after washing. Please DO NOT use if you have an allergy to CHG or antibacterial soaps.  If your skin becomes reddened/irritated stop using the CHG and inform your nurse when you arrive at Short Stay. Do not  shave (including legs and underarms) for at least 48 hours prior to the first CHG shower.  You may shave your face/neck.  Please follow these instructions carefully:  1.  Shower with CHG Soap the night before surgery and the  morning of surgery.  2.  If you choose to wash your hair, wash your hair first as usual with your normal  shampoo.  3.  After you shampoo, rinse your hair and body thoroughly to remove the shampoo.                             4.  Use CHG as you would any other liquid soap.  You can apply chg directly to the skin and wash.  Gently with a scrungie or clean washcloth.  5.  Apply the CHG Soap to your body ONLY FROM THE NECK DOWN.   Do not use on face/ open                           Wound or open sores. Avoid contact with eyes, ears mouth and genitals (private parts).                       Wash face,  Genitals (private parts) with your normal soap.             6.  Wash thoroughly, paying special attention to the area where your  surgery  will be performed.  7.  Thoroughly rinse your body with warm water from the neck down.  8.  DO NOT shower/wash with your normal soap after using and rinsing off the CHG Soap.            9.  Pat yourself dry with a clean towel.            10.  Wear clean pajamas.            11.  Place clean sheets on your bed the night of your first shower and do not  sleep with pets.  ON THE DAY OF SURGERY : Do not apply any lotions/deodorants the morning of surgery.  Please wear clean clothes to the hospital/surgery center.    FAILURE TO FOLLOW THESE INSTRUCTIONS MAY RESULT IN THE CANCELLATION OF YOUR SURGERY  PATIENT SIGNATURE_________________________________  NURSE SIGNATURE__________________________________  ________________________________________________________________________

## 2024-07-10 NOTE — Progress Notes (Signed)
 COVID Vaccine received:  []  No [x]  Yes Date of any COVID positive Test in last 90 days:  None  PCP - Charlene Single, MD   Cardiologist - Lurena Red, MD  Vascular Surgery- Penne Colorado, MD   Chest x-ray - 02-26-2022 1v  Epic EKG - 12-03-2021    will repeat  Stress Test -  ECHO - 12-22-2021  Epic Cardiac Cath -  CT Coronary Calcium score:   Bowel Prep - [x]  No  []   Yes ______  Pacemaker / ICD device [x]  No []  Yes   Spinal Cord Stimulator:[x]  No []  Yes       History of Sleep Apnea? [x]  No []  Yes   CPAP used?- [x]  No []  Yes    Does the patient monitor blood sugar?   []  N/A   [x]  No []  Yes  Patient has: []  NO Hx DM   []  Pre-DM   []  DM1  [x]   DM2 Last A1c was: 7.1  on  02-02-2022       FARXIGA- Hold x 72 hours. Last dose taken on 07-14-24  Blood Thinner / Instructions:None Aspirin  Instructions:  ASA 81 mg   hold x 1 week. Patient aware  ERAS Protocol Ordered: [x]  No  []  Yes Patient is to be NPO after: MN Prior  Dental hx: [x]  Dentures: Full set dentures []  N/A      []  Bridge or Partial:                   []  Loose or Damaged teeth:   Activity level: Able to walk up 2 flights of stairs without becoming significantly short of breath or having chest pain?  []  No   [x]    Yes   Anesthesia review: DM2, GERD, s/p EVAR 02-26-22 by Dr. Colorado (ASA 81 mg) Postop RLE thrombus- had Rt CFA endarterectomy.   Patient denies any S&S of respiratory illness or Covid - no shortness of breath, fever, cough or chest pain at PAT appointment.  Patient verbalized understanding and agreement to the Pre-Surgical Instructions that were given to them at this PAT appointment. Patient was also educated of the need to review these PAT instructions again prior to his surgery.I reviewed the appropriate phone numbers to call if they have any and questions or concerns.

## 2024-07-11 ENCOUNTER — Other Ambulatory Visit: Payer: Self-pay

## 2024-07-11 ENCOUNTER — Encounter (HOSPITAL_COMMUNITY)
Admission: RE | Admit: 2024-07-11 | Discharge: 2024-07-11 | Disposition: A | Discharge status: Home / Self Care | Source: Ambulatory Visit | Attending: Urology | Admitting: Urology

## 2024-07-11 ENCOUNTER — Encounter (HOSPITAL_COMMUNITY): Payer: Self-pay

## 2024-07-11 VITALS — BP 142/64 | HR 68 | Temp 98.3°F | Resp 16 | Ht 68.0 in | Wt 184.0 lb

## 2024-07-11 DIAGNOSIS — Z7984 Long term (current) use of oral hypoglycemic drugs: Secondary | ICD-10-CM | POA: Insufficient documentation

## 2024-07-11 DIAGNOSIS — I10 Essential (primary) hypertension: Secondary | ICD-10-CM | POA: Insufficient documentation

## 2024-07-11 DIAGNOSIS — I491 Atrial premature depolarization: Secondary | ICD-10-CM | POA: Diagnosis not present

## 2024-07-11 DIAGNOSIS — Z01818 Encounter for other preprocedural examination: Secondary | ICD-10-CM | POA: Insufficient documentation

## 2024-07-11 DIAGNOSIS — E119 Type 2 diabetes mellitus without complications: Secondary | ICD-10-CM | POA: Diagnosis not present

## 2024-07-11 DIAGNOSIS — R338 Other retention of urine: Secondary | ICD-10-CM | POA: Diagnosis not present

## 2024-07-11 DIAGNOSIS — Z7982 Long term (current) use of aspirin: Secondary | ICD-10-CM | POA: Diagnosis not present

## 2024-07-11 DIAGNOSIS — Z87891 Personal history of nicotine dependence: Secondary | ICD-10-CM | POA: Diagnosis not present

## 2024-07-11 DIAGNOSIS — N401 Enlarged prostate with lower urinary tract symptoms: Secondary | ICD-10-CM | POA: Insufficient documentation

## 2024-07-11 HISTORY — DX: Peripheral vascular disease, unspecified: I73.9

## 2024-07-11 HISTORY — DX: Personal history of urinary calculi: Z87.442

## 2024-07-11 HISTORY — DX: Pneumonia, unspecified organism: J18.9

## 2024-07-11 HISTORY — DX: Type 2 diabetes mellitus without complications: E11.9

## 2024-07-11 HISTORY — DX: Essential (primary) hypertension: I10

## 2024-07-11 LAB — CBC
HCT: 48.1 % (ref 39.0–52.0)
Hemoglobin: 15.1 g/dL (ref 13.0–17.0)
MCH: 26.6 pg (ref 26.0–34.0)
MCHC: 31.4 g/dL (ref 30.0–36.0)
MCV: 84.7 fL (ref 80.0–100.0)
Platelets: 355 K/uL (ref 150–400)
RBC: 5.68 MIL/uL (ref 4.22–5.81)
RDW: 15 % (ref 11.5–15.5)
WBC: 8.7 K/uL (ref 4.0–10.5)
nRBC: 0 % (ref 0.0–0.2)

## 2024-07-11 LAB — BASIC METABOLIC PANEL WITH GFR
Anion gap: 9 (ref 5–15)
BUN: 14 mg/dL (ref 8–23)
CO2: 21 mmol/L — ABNORMAL LOW (ref 22–32)
Calcium: 9.9 mg/dL (ref 8.9–10.3)
Chloride: 107 mmol/L (ref 98–111)
Creatinine, Ser: 0.88 mg/dL (ref 0.61–1.24)
GFR, Estimated: >60 mL/min (ref >60)
Glucose, Bld: 125 mg/dL — ABNORMAL HIGH (ref 70–99)
Potassium: 4 mmol/L (ref 3.5–5.1)
Sodium: 137 mmol/L (ref 135–145)

## 2024-07-11 LAB — GLUCOSE, CAPILLARY: Glucose-Capillary: 126 mg/dL — ABNORMAL HIGH (ref 70–99)

## 2024-07-12 LAB — HEMOGLOBIN A1C
Hgb A1c MFr Bld: 7.1 % — ABNORMAL HIGH (ref 4.8–5.6)
Mean Plasma Glucose: 157 mg/dL

## 2024-07-13 ENCOUNTER — Encounter (HOSPITAL_COMMUNITY): Payer: Self-pay

## 2024-07-13 NOTE — Anesthesia Preprocedure Evaluation (Addendum)
 Anesthesia Evaluation  Patient identified by MRN, date of birth, ID band Patient awake    Reviewed: Allergy & Precautions, NPO status , Patient's Chart, lab work & pertinent test results  History of Anesthesia Complications Negative for: history of anesthetic complications  Airway Mallampati: II  TM Distance: >3 FB Neck ROM: Full    Dental  (+) Edentulous Lower, Edentulous Upper   Pulmonary former smoker   Pulmonary exam normal        Cardiovascular hypertension, Pt. on medications + Peripheral Vascular Disease  Normal cardiovascular exam   '22 TTE - EF 60 to 65%. Grade I diastolic dysfunction (impaired relaxation). Left atrial size was mildly dilated.     Neuro/Psych negative neurological ROS  negative psych ROS   GI/Hepatic Neg liver ROS,GERD  Medicated and Controlled,,  Endo/Other  diabetes   Obesity Pre-DM   Renal/GU negative Renal ROS     Musculoskeletal  (+) Arthritis ,    Abdominal   Peds  Hematology negative hematology ROS (+)   Anesthesia Other Findings   Reproductive/Obstetrics                              Anesthesia Physical Anesthesia Plan  ASA: 3  Anesthesia Plan: General   Post-op Pain Management: Tylenol  PO (pre-op)* and Celebrex  PO (pre-op)*   Induction: Intravenous  PONV Risk Score and Plan: 2 and Treatment may vary due to age or medical condition  Airway Management Planned: Oral ETT and LMA  Additional Equipment: None  Intra-op Plan:   Post-operative Plan: Extubation in OR  Informed Consent: I have reviewed the patients History and Physical, chart, labs and discussed the procedure including the risks, benefits and alternatives for the proposed anesthesia with the patient or authorized representative who has indicated his/her understanding and acceptance.     Dental advisory given  Plan Discussed with: CRNA and Anesthesiologist  Anesthesia Plan  Comments: (See PAT note from 7/16  DISCUSSION: Charles Hartman is a 78 yo male who presents to PAT prior to surgery above. PMH of former smoking, HTN, AAA s/p EVAR (2023), GERD, DM (A1c 7.1), arthritis. . Pt follows with Vascular for hx of AAA s/p EVAR in 2023 c/b RLE limb ischemia requiring right common femoral endarterectomy with patch angioplasty. Last seen in clinic on 11/02/2023 and noted to be doing well. Taking ASA and statin. BP is controlled. He was advised to f/u in 1 year for repeat surveillance imaging. Echo 12/22/2021:   IMPRESSIONS      1. Left ventricular ejection fraction, by estimation, is 60 to 65%. The left ventricle has normal function. The left ventricle has no regional wall motion abnormalities. Left ventricular diastolic parameters are consistent with Grade I diastolic dysfunction (impaired relaxation).  2. Right ventricular systolic function is normal. The right ventricular size is normal. There is normal pulmonary artery systolic pressure.  3. Left atrial size was mildly dilated.  4. The mitral valve is normal in structure. No evidence of mitral valve regurgitation. No evidence of mitral stenosis.  5. The aortic valve is normal in structure. Aortic valve regurgitation is not visualized. No aortic stenosis is present.  6. The inferior vena cava is normal in size with greater than 50% respiratory variability, suggesting right atrial pressure of 3 mmHg. )         Anesthesia Quick Evaluation

## 2024-07-13 NOTE — Progress Notes (Signed)
 Case: [id number] Date/Time: 07/18/24 0715   Procedure: TURP (TRANSURETHRAL RESECTION OF PROSTATE)   Anesthesia type: General   Diagnosis: Enlarged prostate with urinary retention [N40.1, R33.8]   Pre-op diagnosis: BENIGN PROSTATIC HYPERPLASIA   Location: [facility]   Surgeons: Carolee Sherwood JONETTA DOUGLAS, MD        DISCUSSION: Charles Hartman is a 78 yo male who presents to PAT prior to surgery above. PMH of former smoking, HTN, AAA s/p EVAR (2023), GERD, DM (A1c 7.1), arthritis. . Pt follows with Vascular for hx of AAA s/p EVAR in 2023 c/b RLE limb ischemia requiring right common femoral endarterectomy with patch angioplasty. Last seen in clinic on 11/02/2023 and noted to be doing well. Taking ASA and statin. BP is controlled. He was advised to f/u in 1 year for repeat surveillance imaging.  LD Farxiga: 7/19  VS: BP (!) 142/64 Comment: right arm sitting  Pulse 68   Temp 36.8 C (Oral)   Resp 16   Ht 5' 8 (1.727 m)   Wt 83.5 kg   SpO2 94%   BMI 27.98 kg/m   PROVIDERS: Hamrick, Charlene CROME, MD Vascular Surgery- Penne Colorado, MD   LABS: Labs reviewed: Acceptable for surgery. (all labs ordered are listed, but only abnormal results are displayed)  Labs Reviewed  BASIC METABOLIC PANEL WITH GFR - Abnormal; Notable for the following components:      Result Value   CO2 21 (*)    Glucose, Bld 125 (*)    All other components within normal limits  HEMOGLOBIN A1C - Abnormal; Notable for the following components:   Hgb A1c MFr Bld 7.1 (*)    All other components within normal limits  GLUCOSE, CAPILLARY - Abnormal; Notable for the following components:   Glucose-Capillary 126 (*)    All other components within normal limits  CBC  TYPE AND SCREEN     IMAGES:  CTA chest 04/07/2022:  IMPRESSION: Thoracic aortic atherosclerosis without thoracic aortic aneurysm, dissection, or other acute aortic process.   Negative for acute pulmonary embolus   Native coronary  atherosclerosis   5 mm peripheral subpleural right lower lobe pulmonary nodule.   No follow-up needed if patient is low-risk.This recommendation follows the consensus statement: Guidelines for Management of Incidental Pulmonary Nodules Detected on CT Images: From the Fleischner Society 2017; Radiology 2017; 284:228-243.   Status post bifurcated aortic stent graft repair of the infrarenal AAA. Native aneurysm sac diameter is slightly smaller. Measurements as above. Negative for endoleak.   No other acute intra-abdominal or pelvic vascular finding.   Colonic diverticulosis without acute inflammatory process.   Similar marked prostate enlargement.  EKG 07/11/24:  Sinus rhythm with Premature atrial complexes, rate 71 Right bundle branch block Left anterior fascicular block Bifascicular block Minimal voltage criteria for LVH, may be normal variant ( R in aVL ) Abnormal ECG   CV: Echo 12/22/2021:  IMPRESSIONS    1. Left ventricular ejection fraction, by estimation, is 60 to 65%. The left ventricle has normal function. The left ventricle has no regional wall motion abnormalities. Left ventricular diastolic parameters are consistent with Grade I diastolic dysfunction (impaired relaxation).  2. Right ventricular systolic function is normal. The right ventricular size is normal. There is normal pulmonary artery systolic pressure.  3. Left atrial size was mildly dilated.  4. The mitral valve is normal in structure. No evidence of mitral valve regurgitation. No evidence of mitral stenosis.  5. The aortic valve is normal in structure.  Aortic valve regurgitation is not visualized. No aortic stenosis is present.  6. The inferior vena cava is normal in size with greater than 50% respiratory variability, suggesting right atrial pressure of 3 mmHg. Past Medical History:  Diagnosis Date   AAA (abdominal aortic aneurysm) (HCC)    had EVAR 02-26-2022 by Dr. Sheree   Arthritis    Diabetes  mellitus without complication (HCC)    Enlarged prostate    GERD (gastroesophageal reflux disease)    History of kidney stones    Hypertension    Peripheral vascular disease (HCC)    Pneumonia     Past Surgical History:  Procedure Laterality Date   ABDOMINAL AORTIC ENDOVASCULAR STENT GRAFT N/A 02/26/2022   Procedure: ENDOVASCULAR AORTIC STENT GRAFT REPAIR;  Surgeon: Sheree Penne Bruckner, MD;  Location: [facility] OR;  Service: Vascular;  Laterality: N/A;   ENDARTERECTOMY FEMORAL Right 02/26/2022   Procedure: ENDARTERECTOMY FEMORAL;  Surgeon: Sheree Penne Bruckner, MD;  Location: [facility] OR;  Service: Vascular;  Laterality: Right;   EUS N/A 01/22/2015   Procedure: ESOPHAGEAL ENDOSCOPIC ULTRASOUND (EUS) RADIAL;  Surgeon: Elsie Cree, MD;  Location: [facility];  Service: Endoscopy;  Laterality: N/A;   FINE NEEDLE ASPIRATION N/A 01/22/2015   Procedure: FINE NEEDLE ASPIRATION (FNA) LINEAR;  Surgeon: Elsie Cree, MD;  Location: [facility];  Service: Endoscopy;  Laterality: N/A;   HERNIA REPAIR     bilateral inguinal hernia   PATCH ANGIOPLASTY Right 02/26/2022   Procedure: PATCH ANGIOPLASTY WITH BOVINE PATCH 1 cm x 6 cm;  Surgeon: Sheree Penne Bruckner, MD;  Location: [facility] OR;  Service: Vascular;  Laterality: Right;   THROMBECTOMY FEMORAL ARTERY Right 02/26/2022   Procedure: THROMBECTOMY RIGHT LOWER EXTREMETY;  Surgeon: Sheree Penne Bruckner, MD;  Location: [facility] OR;  Service: Vascular;  Laterality: Right;   TRANSURETHRAL RESECTION OF PROSTATE  2008   ULTRASOUND GUIDANCE FOR VASCULAR ACCESS  02/26/2022   Procedure: ULTRASOUND GUIDANCE FOR VASCULAR ACCESS, BILATERAL FEMORAL ARTERIES;  Surgeon: Sheree Penne Bruckner, MD;  Location: [facility] OR;  Service: Vascular;;    MEDICATIONS:  aspirin  EC 81 MG tablet   FARXIGA 10 MG TABS tablet   fenofibrate  micronized (LOFIBRA) 67 MG capsule   finasteride (PROSCAR) 5 MG tablet   lovastatin (MEVACOR) 40 MG tablet   mometasone (NASONEX) 50  MCG/ACT nasal spray   oxyCODONE -acetaminophen  (PERCOCET/ROXICET) 5-325 MG tablet   RABEprazole (ACIPHEX) 20 MG tablet   Simethicone (ANTI GAS PO)   tamsulosin  (FLOMAX ) 0.4 MG CAPS capsule   temazepam  (RESTORIL ) 15 MG capsule   Tetrahydrozoline HCl (VISINE OP)   No current facility-administered medications for this encounter.    Burnard CHRISTELLA Odis DEVONNA [facility] Short Stay/Anesthesiology [facility] Phone [phone] 07/13/2024 12:01 PM

## 2024-07-18 ENCOUNTER — Other Ambulatory Visit: Payer: Self-pay

## 2024-07-18 ENCOUNTER — Ambulatory Visit (HOSPITAL_BASED_OUTPATIENT_CLINIC_OR_DEPARTMENT_OTHER): Admitting: Anesthesiology

## 2024-07-18 ENCOUNTER — Encounter (HOSPITAL_COMMUNITY): Payer: Self-pay | Admitting: Urology

## 2024-07-18 ENCOUNTER — Ambulatory Visit (HOSPITAL_COMMUNITY): Payer: Self-pay | Admitting: Physician Assistant

## 2024-07-18 ENCOUNTER — Encounter (HOSPITAL_COMMUNITY)
Admission: RE | Disposition: A | Discharge status: Home / Self Care | Payer: Self-pay | Source: Home / Self Care | Attending: Urology

## 2024-07-18 ENCOUNTER — Ambulatory Visit (HOSPITAL_COMMUNITY)
Admission: RE | Admit: 2024-07-18 | Discharge: 2024-07-18 | Disposition: A | Discharge status: Home / Self Care | Attending: Urology | Admitting: Urology

## 2024-07-18 DIAGNOSIS — E1151 Type 2 diabetes mellitus with diabetic peripheral angiopathy without gangrene: Secondary | ICD-10-CM

## 2024-07-18 DIAGNOSIS — E119 Type 2 diabetes mellitus without complications: Secondary | ICD-10-CM | POA: Diagnosis not present

## 2024-07-18 DIAGNOSIS — K219 Gastro-esophageal reflux disease without esophagitis: Secondary | ICD-10-CM | POA: Diagnosis not present

## 2024-07-18 DIAGNOSIS — Z87891 Personal history of nicotine dependence: Secondary | ICD-10-CM | POA: Insufficient documentation

## 2024-07-18 DIAGNOSIS — R338 Other retention of urine: Secondary | ICD-10-CM | POA: Diagnosis present

## 2024-07-18 DIAGNOSIS — Z01818 Encounter for other preprocedural examination: Secondary | ICD-10-CM

## 2024-07-18 DIAGNOSIS — I10 Essential (primary) hypertension: Secondary | ICD-10-CM

## 2024-07-18 DIAGNOSIS — N320 Bladder-neck obstruction: Secondary | ICD-10-CM | POA: Diagnosis not present

## 2024-07-18 DIAGNOSIS — N401 Enlarged prostate with lower urinary tract symptoms: Secondary | ICD-10-CM | POA: Insufficient documentation

## 2024-07-18 DIAGNOSIS — N138 Other obstructive and reflux uropathy: Secondary | ICD-10-CM | POA: Insufficient documentation

## 2024-07-18 HISTORY — PX: TRANSURETHRAL RESECTION OF PROSTATE: SHX73

## 2024-07-18 LAB — GLUCOSE, CAPILLARY
Glucose-Capillary: 137 mg/dL — ABNORMAL HIGH (ref 70–99)
Glucose-Capillary: 172 mg/dL — ABNORMAL HIGH (ref 70–99)

## 2024-07-18 LAB — TYPE AND SCREEN
ABO/RH(D): A POS
Antibody Screen: NEGATIVE

## 2024-07-18 SURGERY — TURP (TRANSURETHRAL RESECTION OF PROSTATE)
Anesthesia: General

## 2024-07-18 MED ORDER — PHENYLEPHRINE 80 MCG/ML (10ML) SYRINGE FOR IV PUSH (FOR BLOOD PRESSURE SUPPORT)
PREFILLED_SYRINGE | INTRAVENOUS | Status: DC | PRN
  Administered 2024-07-18: 80 ug via INTRAVENOUS

## 2024-07-18 MED ORDER — ORAL CARE MOUTH RINSE
15.0000 mL | Freq: Once | OROMUCOSAL | Status: AC
Start: 2024-07-18 — End: 2024-07-18

## 2024-07-18 MED ORDER — PROPOFOL 10 MG/ML IV BOLUS
INTRAVENOUS | Status: DC | PRN
  Administered 2024-07-18: 150 mg via INTRAVENOUS

## 2024-07-18 MED ORDER — CHLORHEXIDINE GLUCONATE 0.12 % MT SOLN
15.0000 mL | Freq: Once | OROMUCOSAL | Status: AC
  Administered 2024-07-18: 15 mL via OROMUCOSAL

## 2024-07-18 MED ORDER — TRANEXAMIC ACID-NACL 1000-0.7 MG/100ML-% IV SOLN
1000.0000 mg | INTRAVENOUS | Status: DC
  Filled 2024-07-18: qty 100

## 2024-07-18 MED ORDER — LIDOCAINE HCL (PF) 2 % IJ SOLN
INTRAMUSCULAR | Status: AC
  Filled 2024-07-18: qty 5

## 2024-07-18 MED ORDER — ACETAMINOPHEN 500 MG PO TABS
1000.0000 mg | ORAL_TABLET | Freq: Once | ORAL | Status: AC
  Administered 2024-07-18: 1000 mg via ORAL
  Filled 2024-07-18: qty 2

## 2024-07-18 MED ORDER — FENTANYL CITRATE (PF) 100 MCG/2ML IJ SOLN
INTRAMUSCULAR | Status: DC | PRN
  Administered 2024-07-18 (×2): 50 ug via INTRAVENOUS

## 2024-07-18 MED ORDER — CELECOXIB 200 MG PO CAPS
200.0000 mg | ORAL_CAPSULE | Freq: Once | ORAL | Status: AC
  Administered 2024-07-18: 200 mg via ORAL
  Filled 2024-07-18: qty 1

## 2024-07-18 MED ORDER — SODIUM CHLORIDE 0.9 % IV SOLN
2.0000 g | INTRAVENOUS | Status: AC
  Administered 2024-07-18: 2 g via INTRAVENOUS
  Filled 2024-07-18: qty 20

## 2024-07-18 MED ORDER — PROPOFOL 10 MG/ML IV BOLUS
INTRAVENOUS | Status: AC
  Filled 2024-07-18: qty 20

## 2024-07-18 MED ORDER — ONDANSETRON HCL 4 MG/2ML IJ SOLN
INTRAMUSCULAR | Status: DC | PRN
  Administered 2024-07-18: 4 mg via INTRAVENOUS

## 2024-07-18 MED ORDER — SUGAMMADEX SODIUM 200 MG/2ML IV SOLN
INTRAVENOUS | Status: AC
  Filled 2024-07-18: qty 2

## 2024-07-18 MED ORDER — INSULIN ASPART 100 UNIT/ML IJ SOLN: 0.0000 [IU] | INTRAMUSCULAR | Status: DC | PRN

## 2024-07-18 MED ORDER — SODIUM CHLORIDE 0.9 % IR SOLN
Status: DC | PRN
  Administered 2024-07-18 (×3): 3000 mL
  Administered 2024-07-18: 6000 mL
  Administered 2024-07-18: 3000 mL
  Administered 2024-07-18: 6000 mL
  Administered 2024-07-18 (×2): 3000 mL
  Administered 2024-07-18: 6000 mL
  Administered 2024-07-18 (×2): 3000 mL

## 2024-07-18 MED ORDER — ONDANSETRON HCL 4 MG/2ML IJ SOLN
INTRAMUSCULAR | Status: AC
  Filled 2024-07-18: qty 2

## 2024-07-18 MED ORDER — DEXMEDETOMIDINE HCL IN NACL 80 MCG/20ML IV SOLN
INTRAVENOUS | Status: DC | PRN
  Administered 2024-07-18 (×2): 4 ug via INTRAVENOUS

## 2024-07-18 MED ORDER — LIDOCAINE HCL (PF) 2 % IJ SOLN
INTRAMUSCULAR | Status: DC | PRN
  Administered 2024-07-18: 50 mg via INTRADERMAL

## 2024-07-18 MED ORDER — DEXMEDETOMIDINE HCL IN NACL 80 MCG/20ML IV SOLN
INTRAVENOUS | Status: AC
  Filled 2024-07-18: qty 20

## 2024-07-18 MED ORDER — TRANEXAMIC ACID-NACL 1000-0.7 MG/100ML-% IV SOLN
INTRAVENOUS | Status: DC | PRN
  Administered 2024-07-18: 1000 mg via INTRAVENOUS

## 2024-07-18 MED ORDER — LACTATED RINGERS IV SOLN: INTRAVENOUS | Status: DC

## 2024-07-18 MED ORDER — PHENYLEPHRINE 80 MCG/ML (10ML) SYRINGE FOR IV PUSH (FOR BLOOD PRESSURE SUPPORT)
PREFILLED_SYRINGE | INTRAVENOUS | Status: AC
  Filled 2024-07-18: qty 10

## 2024-07-18 MED ORDER — STERILE WATER FOR IRRIGATION IR SOLN
Status: DC | PRN
  Administered 2024-07-18: 500 mL

## 2024-07-18 MED ORDER — SUGAMMADEX SODIUM 200 MG/2ML IV SOLN
INTRAVENOUS | Status: DC | PRN
  Administered 2024-07-18: 200 mg via INTRAVENOUS

## 2024-07-18 MED ORDER — ROCURONIUM BROMIDE 10 MG/ML (PF) SYRINGE
PREFILLED_SYRINGE | INTRAVENOUS | Status: DC | PRN
  Administered 2024-07-18: 20 mg via INTRAVENOUS
  Administered 2024-07-18: 50 mg via INTRAVENOUS
  Administered 2024-07-18: 20 mg via INTRAVENOUS

## 2024-07-18 MED ORDER — OXYCODONE-ACETAMINOPHEN 5-325 MG PO TABS: 1.0000 | ORAL_TABLET | ORAL | 0 refills | Status: AC | PRN

## 2024-07-18 MED ORDER — ROCURONIUM BROMIDE 10 MG/ML (PF) SYRINGE
PREFILLED_SYRINGE | INTRAVENOUS | Status: AC
  Filled 2024-07-18: qty 10

## 2024-07-18 MED ORDER — FENTANYL CITRATE (PF) 100 MCG/2ML IJ SOLN
INTRAMUSCULAR | Status: AC
  Filled 2024-07-18: qty 2

## 2024-07-18 MED ORDER — DEXAMETHASONE SODIUM PHOSPHATE 10 MG/ML IJ SOLN
INTRAMUSCULAR | Status: DC | PRN
  Administered 2024-07-18: 8 mg via INTRAVENOUS

## 2024-07-18 MED ORDER — LACTATED RINGERS IV SOLN: INTRAVENOUS | Status: DC | PRN

## 2024-07-18 MED ORDER — DEXAMETHASONE SODIUM PHOSPHATE 10 MG/ML IJ SOLN
INTRAMUSCULAR | Status: AC
  Filled 2024-07-18: qty 1

## 2024-07-18 SURGICAL SUPPLY — 18 items
BAG URINE DRAIN 2000ML AR STRL (UROLOGICAL SUPPLIES) ×1 IMPLANT
BAG URO CATCHER STRL LF (MISCELLANEOUS) ×1 IMPLANT
CATH FOLEY 3WAY 30CC 22FR (CATHETERS) IMPLANT
CATH URTH STD 24FR FL 3W 2 (CATHETERS) ×1 IMPLANT
CLOTH BEACON ORANGE TIMEOUT ST (SAFETY) ×1 IMPLANT
DRAPE FOOT SWITCH (DRAPES) ×1 IMPLANT
GLOVE BIO SURGEON STRL SZ7.5 (GLOVE) ×1 IMPLANT
GOWN STRL REUS W/ TWL XL LVL3 (GOWN DISPOSABLE) ×1 IMPLANT
HOLDER FOLEY CATH W/STRAP (MISCELLANEOUS) ×1 IMPLANT
KIT TURNOVER KIT A (KITS) ×1 IMPLANT
LOOP CUT BIPOLAR 24F LRG (ELECTROSURGICAL) ×1 IMPLANT
MANIFOLD NEPTUNE II (INSTRUMENTS) ×1 IMPLANT
PACK CYSTO (CUSTOM PROCEDURE TRAY) ×1 IMPLANT
PAD PREP 24X48 CUFFED NSTRL (MISCELLANEOUS) ×1 IMPLANT
SYR 30ML LL (SYRINGE) ×1 IMPLANT
SYRINGE TOOMEY IRRIG 70ML (MISCELLANEOUS) ×1 IMPLANT
TUBING CONNECTING 10 (TUBING) ×1 IMPLANT
TUBING UROLOGY SET (TUBING) ×1 IMPLANT

## 2024-07-18 NOTE — H&P (Signed)
 CC/HPI: cc1; Urinary retention   HPI: 78 year old man with a past medical history of diabetes type 2, nephrolithiasis, BPH presents today due to concerns of urinary retention. Approximately 3 weeks ago he developed upper respiratory illness and was treated for pneumonia. During his treatment he became very weak, fatigued and incontinent. He was found to have hyponatremia, dehydration and urinary retention. A Foley catheter was placed during his hospitalization. 1 L of urine was drained from his bladder. He was started on finasteride and tamsulosin . He has had prostatic surgery in the past. His catheter has been draining well. No fevers. REmains fatigued. Prior to this event, he would urinate about 3-4 times per night, had a good stream, did not strain to urinate, has frequency of every 2-3 hours.   05/23/24: Tylyn returns today in follow up. Last week he was taught CIC. He continues on tamsulosin  and finasteride. He is scheduled to follow up with MD in July for further discussion and management of BPH. Today he is accompanied by his daughter, they express frustration with his retention and the need to perform CIC vs foley catheter. He has been performing CIC without difficulty, but would prefer to have the foley placed. He has been unable to void any on his own. He denies gross hematuria, dysuria, flank pain, abdominal pain, fever or chills.   06/28/2024  Patient underwent urodynamics that revealed the following:   UDS SUMMARY  Mr. Trost held a max capacity of approx. 429 mls. His 1st sensation was felt at 186 mls. No instability was noted. He was able to generate a voluntary contraction but did not void. Increased EMG activity was noted during voiding. Trabeculation and some elevation of the bladder base was noted. No reflux was seen. A 16 French Foley catheter was reinserted before the patient left the clinic. He will return for UDS follow up.   Prostate volume was 132.91     ALLERGIES: No Allergies     MEDICATIONS: Aspirin  1 PO Daily  Finasteride 5 MG Tablet 1 tablet PO Daily  Tamsulosin  HCl 0.4 MG Capsule 1 capsule PO Daily  Fenofibrate  67 MG Capsule 1 capsule PO Daily  Lovastatin 40 MG Tablet 1 tablet PO Daily  Nasacort Allergy 24HR 1 PO Daily  RABEprazole Sodium 20 MG Tablet Delayed Release 1 tablet PO Daily  Temazepam  15 MG Capsule 1 capsule PO Daily     GU PSH: Complex cystometrogram, w/ void pressure and urethral pressure profile studies, any technique - 06/06/2024 Complex Uroflow - 06/06/2024 Emg surf Electrd - 06/06/2024 Inject For cystogram - 06/06/2024 Intrabd voidng Press - 06/06/2024     NON-GU PSH: Extensive Prostate Surgery Hernia Repair Visit Complexity (formerly GPC1X) - 05/23/2024     GU PMH: Urinary Retention - 06/06/2024, - 05/23/2024, - 05/16/2024 BPH w/LUTS - 05/23/2024, - 05/16/2024 Ureteral calculus    NON-GU PMH: Arthritis Diabetes Type 2 GERD    FAMILY HISTORY: No Family History    SOCIAL HISTORY: Marital Status: Widowed Current Smoking Status: Patient does not smoke anymore. Has not smoked since 04/26/2004.   Tobacco Use Assessment Completed: Used Tobacco in last 30 days? Does not drink anymore.  Drinks 2 caffeinated drinks per day.    REVIEW OF SYSTEMS:    GU Review Male:   Patient denies frequent urination, hard to postpone urination, burning/ pain with urination, get up at night to urinate, leakage of urine, stream starts and stops, trouble starting your stream, have to strain to urinate , erection problems, and penile pain.  Gastrointestinal (Upper):   Patient denies nausea, vomiting, and indigestion/ heartburn.  Gastrointestinal (Lower):   Patient denies diarrhea and constipation.  Constitutional:   Patient denies fever, night sweats, weight loss, and fatigue.  Skin:   Patient denies skin rash/ lesion and itching.  Eyes:   Patient denies blurred vision and double vision.  Ears/ Nose/ Throat:   Patient denies sore throat and sinus problems.   Hematologic/Lymphatic:   Patient denies swollen glands and easy bruising.  Cardiovascular:   Patient denies leg swelling and chest pains.  Respiratory:   Patient denies cough and shortness of breath.  Endocrine:   Patient denies excessive thirst.  Musculoskeletal:   Patient denies back pain and joint pain.  Neurological:   Patient denies headaches and dizziness.  Psychologic:   Patient denies depression and anxiety.   VITAL SIGNS: None   MULTI-SYSTEM PHYSICAL EXAMINATION:    Constitutional: Well-nourished. No physical deformities. Normally developed. Good grooming.  Respiratory: No labored breathing, no use of accessory muscles.   Gastrointestinal: No mass, no tenderness, no rigidity, non obese abdomen.  Eyes: Normal conjunctivae. Normal eyelids.  Musculoskeletal: Normal gait and station of head and neck.     Complexity of Data:  Source Of History:  Patient  Records Review:   Previous Doctor Records, Previous Patient Records  Urodynamics Review:   Review Urodynamics Tests  X-Ray Review: Prostate Ultrasound: Reviewed Films. Discussed With Patient.     PROCEDURES:         Flexible Cystoscopy - 52000  Risks, benefits, and some of the potential complications of the procedure were discussed at length with the patient including infection, bleeding, voiding discomfort, urinary retention, fever, chills, sepsis, and others. All questions were answered. Informed consent was obtained. Antibiotic prophylaxis was given. Sterile technique and intraurethral analgesia were used.  Meatus:  Normal size. Normal location. Normal condition.  Urethra:  No strictures.  External Sphincter:  Normal.  Verumontanum:  Normal.  Prostate:  Obstructing. Severe hyperplasia. Significant lateral lobe projection into the bladder on the left extending several centimeters. He had evidence of prior resection with significant prostatic regrowth.  Bladder Neck:  Non-obstructing.  Ureteral Orifices:  Normal location.  Normal size. Normal shape.   Bladder:  Moderate trabeculation. No tumors. Normal mucosa except for some catheter edema. No stones.      The lower urinary tract was carefully examined. The procedure was well-tolerated and without complications. Antibiotic instructions were given. Instructions were given to call the office immediately for bloody urine, difficulty urinating, urinary retention, painful or frequent urination, fever, chills, nausea, vomiting or other illness. The patient stated that he understood these instructions and would comply with them.        Simple Foley Catheterization - [id number]  A 14 French Foley catheter was inserted into the bladder using sterile technique. The patient was taught routine catheter care.   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 He would like to proceed with surgical resection. I discussed aquablation of the prostate. I specifically discussed the risks including but not limited to bleeding which could require blood transfusion, infection, and injury to surrounding structures.  Also discussed the possibility that the surgery would not improve symptoms though most men have a great improvement in their symptoms. Also discussed the low likelihood but possibility of development of new symptoms such as irritative voiding symptoms or urinary incontinence. Most men will have some degree of urinary urgency and discomfort immediately following the surgery that resolves in a short amount of time. He understands that most often this is an outpatient procedure but occasionally patients require hospitalization for continuous bladder irrigation in the event of excess bleeding. He also understands the possibility of being  sent home with a urethral catheter. The patient expressed understanding and is eager to proceed.   CC: Dr. Stephanie      Signed by Sherwood Edison, III, M.D. on 06/28/24 at 12:33 PM (EDT

## 2024-07-18 NOTE — Anesthesia Procedure Notes (Signed)
 Procedure Name: Intubation Date/Time: 07/18/2024 7:37 AM  Performed by: Obadiah Reyes BROCKS, CRNAPre-anesthesia Checklist: Patient identified, Emergency Drugs available, Suction available and Patient being monitored Patient Re-evaluated:Patient Re-evaluated prior to induction Oxygen Delivery Method: Circle System Utilized Preoxygenation: Pre-oxygenation with 100% oxygen Induction Type: IV induction Ventilation: Mask ventilation without difficulty Laryngoscope Size: Miller and 2 Grade View: Grade I Tube type: Oral Number of attempts: 1 Airway Equipment and Method: Stylet and Oral airway Placement Confirmation: ETT inserted through vocal cords under direct vision, positive ETCO2 and breath sounds checked- equal and bilateral Secured at: 22 cm Tube secured with: Tape Dental Injury: Teeth and Oropharynx as per pre-operative assessment

## 2024-07-18 NOTE — Discharge Instructions (Signed)
 Transurethral Resection of the Prostate (TURP) or Greenlight laser ablation of the Prostate  Care After  Refer to this sheet in the next few weeks. These discharge instructions provide you with general information on caring for yourself after you leave the hospital. Your caregiver may also give you specific instructions. Your treatment has been planned according to the most current medical practices available, but unavoidable complications sometimes occur. If you have any problems or questions after discharge, please call your caregiver.  HOME CARE INSTRUCTIONS   Medications  You may receive medicine for pain management. As your level of discomfort decreases, adjustments in your pain medicines may be made.   Take all medicines as directed.   You may be given a medicine (antibiotic) to kill germs following surgery. Finish all medicines. Let your caregiver know if you have any side effects or problems from the medicine.   If you are on aspirin, it would be best not to restart the aspirin until the blood in the urine clears Hygiene  You can take a shower after surgery.   You should not take a bath while you still have the urethral catheter. Activity  You will be encouraged to get out of bed as much as possible and increase your activity level as tolerated.   Spend the first week in and around your home. For 3 weeks, avoid the following:   Straining.   Running.   Strenuous work.   Walks longer than a few blocks.   Riding for extended periods.   Sexual relations.   Do not lift heavy objects (more than 20 pounds) for at least 1 month. When lifting, use your arms instead of your abdominal muscles.   You will be encouraged to walk as tolerated. Do not exert yourself. Increase your activity level slowly. Remember that it is important to keep moving after an operation of any type. This cuts down on the possibility of developing blood clots.   Your caregiver will tell you when you  can resume driving and light housework. Discuss this at your first office visit after discharge. Diet  No special diet is ordered after a TURP. However, if you are on a special diet for another medical problem, it should be continued.   Normal fluid intake is usually recommended.   Avoid alcohol and caffeinated drinks for 2 weeks. They irritate the bladder. Decaffeinated drinks are okay.   Avoid spicy foods.  Bladder Function  For the first 10 days, empty the bladder whenever you feel a definite desire. Do not try to hold the urine for long periods of time.   Urinating once or twice a night even after you are healed is not uncommon.   You may see some recurrence of blood in the urine after discharge from the hospital. This usually happens within 2 weeks after the procedure.If this occurs, force fluids again as you did in the hospital and reduce your activity.  Bowel Function  You may experience some constipation after surgery. This can be minimized by increasing fluids and fiber in your diet. Drink enough water and fluids to keep your urine clear or pale yellow.   A stool softener may be prescribed for use at home. Do not strain to move your bowels.   If you are requiring increased pain medicine, it is important that you take stool softeners to prevent constipation. This will help to promote proper healing by reducing the need to strain to move your bowels.  Sexual Activity  Semen movement  in the opposite direction and into the bladder (retrograde ejaculation) may occur. Since the semen passes into the bladder, cloudy urine can occur the first time you urinate after intercourse. Or, you may not have an ejaculation during erection. Ask your caregiver when you can resume sexual activity. Retrograde ejaculation and reduced semen discharge should not reduce one's pleasure of intercourse.  Postoperative Visit  Arrange the date and time of your after surgery visit with your caregiver.  Return  to Work  After your recovery is complete, you will be able to return to work and resume all activities. Your caregiver will inform you when you can return to work.    Foley Catheter Care A soft, flexible tube (Foley catheter) may have been placed in your bladder to drain urine and fluid. Follow these instructions: Taking Care of the Catheter  Keep the area where the catheter leaves your body clean.   Attach the catheter to the leg so there is no tension on the catheter.   Keep the drainage bag below the level of the bladder, but keep it OFF the floor.   Do not take long soaking baths. Your caregiver will give instructions about showering.   Wash your hands before touching ANYTHING related to the catheter or bag.   Using mild soap and warm water on a washcloth:   Clean the area closest to the catheter insertion site using a circular motion around the catheter.   Clean the catheter itself by wiping AWAY from the insertion site for several inches down the tube.   NEVER wipe upward as this could sweep bacteria up into the urethra (tube in your body that normally drains the bladder) and cause infection.   Place a small amount of sterile lubricant at the tip of the penis where the catheter is entering.  Taking Care of the Drainage Bags  Two drainage bags may be taken home: a large overnight drainage bag, and a smaller leg bag which fits underneath clothing.   It is okay to wear the overnight bag at any time, but NEVER wear the smaller leg bag at night.   Keep the drainage bag well below the level of your bladder. This prevents backflow of urine into the bladder and allows the urine to drain freely.   Anchor the tubing to your leg to prevent pulling or tension on the catheter. Use tape or a leg strap provided by the hospital.   Empty the drainage bag when it is 1/2 to 3/4 full. Wash your hands before and after touching the bag.   Periodically check the tubing for kinks to make sure  there is no pressure on the tubing which could restrict the flow of urine.  Changing the Drainage Bags  Cleanse both ends of the clean bag with alcohol before changing.   Pinch off the rubber catheter to avoid urine spillage during the disconnection.   Disconnect the dirty bag and connect the clean one.   Empty the dirty bag carefully to avoid a urine spill.   Attach the new bag to the leg with tape or a leg strap.  Cleaning the Drainage Bags  Whenever a drainage bag is disconnected, it must be cleaned quickly so it is ready for the next use.   Wash the bag in warm, soapy water.   Rinse the bag thoroughly with warm water.   Soak the bag for 30 minutes in a solution of white vinegar and water (1 cup vinegar to 1 quart warm  water).   Rinse with warm water.  SEEK MEDICAL CARE IF:   You have chills or night sweats.   You are leaking around your catheter or have problems with your catheter. It is not uncommon to have sporadic leakage around your catheter as a result of bladder spasms. If the leakage stops, there is not much need for concern. If you are uncertain, call your caregiver.   You develop side effects that you think are coming from your medicines.  SEEK IMMEDIATE MEDICAL CARE IF:   You are suddenly unable to urinate. Check to see if there are any kinks in the drainage tubing that may cause this. If you cannot find any kinks, call your caregiver immediately. This is an emergency.   You develop shortness of breath or chest pains.   Bleeding persists or clots develop in your urine.   You have a fever.   You develop pain in your back or over your lower belly (abdomen).   You develop pain or swelling in your legs.   Any problems you are having get worse rather than better.  MAKE SURE YOU:   Understand these instructions.   Will watch your condition.   Will get help right away if you are not doing well or get worse.

## 2024-07-18 NOTE — Op Note (Signed)
 Preoperative diagnosis: Bladder outlet obstruction secondary to BPH, urinary retention  Postoperative diagnosis:  Bladder outlet obstruction secondary to BPH, urinary retention  Procedure:  Cystoscopy with urethral dilation Transurethral resection of the prostate  Surgeon: Sherwood JONETTA Edison, III. M.D.  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 After reviewing the management options for treatment, he elected to proceed with the above surgical procedure(s). We have discussed the potential benefits and risks of the procedure, side effects of the proposed treatment, the likelihood of the patient achieving the goals of the procedure, and any potential problems that might occur during the procedure or recuperation. Informed consent has been obtained.  Description of procedure:  The patient was taken to the operating room and general anesthesia was induced.  The patient was placed in the dorsal lithotomy position, prepped and draped in the usual sterile fashion, and preoperative antibiotics were administered. A preoperative time-out was performed.   The meatus was little bit tight for the resectoscope and I therefore dilated with sounds from 20 [place] up to 28 [place].  Cystourethroscopy was performed.  The patient's urethra was examined and bilobar prostatic hypertrophy with significant intravesical left lateral lobe overgrowth and some right lateral lobe overgrowth.  He had significant bilobar hypertrophy within the prostatic urethra as well.   The bladder was then systematically examined in its entirety. There was no evidence of any bladder tumors, stones, or other mucosal pathology.  The ureteral orifices were identified and marked so as to be avoided during the procedure.  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 Care was taken not to resect distal to the verumontanum.  Hemostasis was then achieved with the cautery and the bladder was emptied and reinspected with no significant bleeding noted at the end of the procedure.    A 22 French 3 way catheter was then placed into the bladder.  The patient appeared to tolerate the procedure well and without complications.  The patient was able to be awakened and transferred to the recovery unit in satisfactory condition.

## 2024-07-18 NOTE — Transfer of Care (Signed)
 Immediate Anesthesia Transfer of Care Note  Patient: Charles Hartman  Procedure(s) Performed: TURP (TRANSURETHRAL RESECTION OF PROSTATE)  Patient Location: PACU  Anesthesia Type:General  Level of Consciousness: awake, alert , and oriented  Airway & Oxygen Therapy: Patient Spontanous Breathing and Patient connected to face mask oxygen  Post-op Assessment: Report given to RN and Post -op Vital signs reviewed and stable  Post vital signs: Reviewed and stable  Last Vitals:  Vitals Value Taken Time  BP    Temp    Pulse    Resp    SpO2      Last Pain:  Vitals:   07/18/24 0609  TempSrc: Oral         Complications: No notable events documented.

## 2024-07-18 NOTE — Anesthesia Postprocedure Evaluation (Signed)
 Anesthesia Post Note  Patient: Charles Hartman  Procedure(s) Performed: TURP (TRANSURETHRAL RESECTION OF PROSTATE)     Patient location during evaluation: PACU Anesthesia Type: General Level of consciousness: awake and alert Pain management: pain level controlled Vital Signs Assessment: post-procedure vital signs reviewed and stable Respiratory status: spontaneous breathing, nonlabored ventilation, respiratory function stable and patient connected to nasal cannula oxygen Cardiovascular status: blood pressure returned to baseline and stable Postop Assessment: no apparent nausea or vomiting Anesthetic complications: no   No notable events documented.  Last Vitals:  Vitals:   07/18/24 0958 07/18/24 1000  BP: 123/68 123/68  Pulse:  (!) 59  Resp:  (!) 21  Temp: 36.8 C   SpO2:  100%    Last Pain:  Vitals:   07/18/24 0609  TempSrc: Oral                 Ty Oshima

## 2024-07-19 ENCOUNTER — Encounter (HOSPITAL_COMMUNITY): Payer: Self-pay | Admitting: Urology

## 2024-07-20 LAB — SURGICAL PATHOLOGY
